# Patient Record
Sex: Female | Born: 1995 | Race: Black or African American | Hispanic: No | Marital: Single | Smoking: Never smoker
Health system: Southern US, Community
[De-identification: ages and names within clinical notes are randomized; demographics above are authoritative.]

## PROBLEM LIST (undated history)

## (undated) DIAGNOSIS — R011 Cardiac murmur, unspecified: Secondary | ICD-10-CM

## (undated) DIAGNOSIS — J9601 Acute respiratory failure with hypoxia: Secondary | ICD-10-CM

## (undated) DIAGNOSIS — J189 Pneumonia, unspecified organism: Secondary | ICD-10-CM

## (undated) DIAGNOSIS — R519 Headache, unspecified: Secondary | ICD-10-CM

## (undated) DIAGNOSIS — R51 Headache: Secondary | ICD-10-CM

## (undated) DIAGNOSIS — D649 Anemia, unspecified: Secondary | ICD-10-CM

## (undated) DIAGNOSIS — J45909 Unspecified asthma, uncomplicated: Secondary | ICD-10-CM

---

## 2017-04-05 HISTORY — PX: HYSTEROSCOPY: SHX211

## 2018-01-09 ENCOUNTER — Other Ambulatory Visit: Payer: Self-pay

## 2018-01-09 ENCOUNTER — Emergency Department (HOSPITAL_COMMUNITY): Payer: BLUE CROSS/BLUE SHIELD

## 2018-01-09 ENCOUNTER — Inpatient Hospital Stay (HOSPITAL_COMMUNITY)
Admission: EM | Admit: 2018-01-09 | Discharge: 2018-01-15 | DRG: 871 | Disposition: A | Discharge status: Home / Self Care | Payer: BLUE CROSS/BLUE SHIELD | Attending: Internal Medicine | Admitting: Internal Medicine

## 2018-01-09 ENCOUNTER — Encounter (HOSPITAL_COMMUNITY): Payer: Self-pay | Admitting: Emergency Medicine

## 2018-01-09 DIAGNOSIS — R0602 Shortness of breath: Secondary | ICD-10-CM

## 2018-01-09 DIAGNOSIS — J129 Viral pneumonia, unspecified: Secondary | ICD-10-CM | POA: Diagnosis present

## 2018-01-09 DIAGNOSIS — A419 Sepsis, unspecified organism: Secondary | ICD-10-CM | POA: Diagnosis present

## 2018-01-09 DIAGNOSIS — B9789 Other viral agents as the cause of diseases classified elsewhere: Secondary | ICD-10-CM | POA: Diagnosis present

## 2018-01-09 DIAGNOSIS — A4189 Other specified sepsis: Principal | ICD-10-CM | POA: Diagnosis present

## 2018-01-09 DIAGNOSIS — R509 Fever, unspecified: Secondary | ICD-10-CM

## 2018-01-09 DIAGNOSIS — J4521 Mild intermittent asthma with (acute) exacerbation: Secondary | ICD-10-CM | POA: Diagnosis present

## 2018-01-09 DIAGNOSIS — R6889 Other general symptoms and signs: Secondary | ICD-10-CM

## 2018-01-09 DIAGNOSIS — J189 Pneumonia, unspecified organism: Secondary | ICD-10-CM | POA: Diagnosis present

## 2018-01-09 DIAGNOSIS — E869 Volume depletion, unspecified: Secondary | ICD-10-CM | POA: Diagnosis present

## 2018-01-09 DIAGNOSIS — E876 Hypokalemia: Secondary | ICD-10-CM | POA: Diagnosis present

## 2018-01-09 DIAGNOSIS — J9601 Acute respiratory failure with hypoxia: Secondary | ICD-10-CM

## 2018-01-09 DIAGNOSIS — J181 Lobar pneumonia, unspecified organism: Secondary | ICD-10-CM | POA: Diagnosis present

## 2018-01-09 HISTORY — DX: Pneumonia, unspecified organism: J18.9

## 2018-01-09 HISTORY — DX: Unspecified asthma, uncomplicated: J45.909

## 2018-01-09 HISTORY — DX: Anemia, unspecified: D64.9

## 2018-01-09 HISTORY — DX: Acute respiratory failure with hypoxia: J96.01

## 2018-01-09 HISTORY — DX: Headache, unspecified: R51.9

## 2018-01-09 HISTORY — DX: Cardiac murmur, unspecified: R01.1

## 2018-01-09 HISTORY — DX: Headache: R51

## 2018-01-09 LAB — BASIC METABOLIC PANEL
Anion gap: 12 (ref 5–15)
BUN: 7 mg/dL (ref 6–20)
CALCIUM: 9.5 mg/dL (ref 8.9–10.3)
CO2: 24 mmol/L (ref 22–32)
CREATININE: 0.74 mg/dL (ref 0.44–1.00)
Chloride: 102 mmol/L (ref 98–111)
GFR calc non Af Amer: >60 mL/min (ref >60)
Glucose, Bld: 119 mg/dL — ABNORMAL HIGH (ref 70–99)
Potassium: 3.7 mmol/L (ref 3.5–5.1)
Sodium: 138 mmol/L (ref 135–145)

## 2018-01-09 LAB — CBC
HCT: 41.1 % (ref 36.0–46.0)
Hemoglobin: 13.1 g/dL (ref 12.0–15.0)
MCH: 29.9 pg (ref 26.0–34.0)
MCHC: 31.9 g/dL (ref 30.0–36.0)
MCV: 93.8 fL (ref 80.0–100.0)
NRBC: 0 % (ref 0.0–0.2)
PLATELETS: 281 10*3/uL (ref 150–400)
RBC: 4.38 MIL/uL (ref 3.87–5.11)
RDW: 12.3 % (ref 11.5–15.5)
WBC: 6.8 10*3/uL (ref 4.0–10.5)

## 2018-01-09 LAB — I-STAT CG4 LACTIC ACID, ED: Lactic Acid, Venous: 2.23 mmol/L (ref 0.5–1.9)

## 2018-01-09 LAB — I-STAT TROPONIN, ED: Troponin i, poc: 0 ng/mL (ref 0.00–0.08)

## 2018-01-09 LAB — I-STAT BETA HCG BLOOD, ED (MC, WL, AP ONLY)

## 2018-01-09 MED ORDER — ALBUTEROL SULFATE (2.5 MG/3ML) 0.083% IN NEBU
5.0000 mg | INHALATION_SOLUTION | Freq: Once | RESPIRATORY_TRACT | Status: AC
  Administered 2018-01-09: 5 mg via RESPIRATORY_TRACT
  Filled 2018-01-09: qty 6

## 2018-01-09 MED ORDER — SODIUM CHLORIDE 0.9 % IV BOLUS
1000.0000 mL | Freq: Once | INTRAVENOUS | Status: AC
  Administered 2018-01-09: 1000 mL via INTRAVENOUS

## 2018-01-09 MED ORDER — ALBUTEROL SULFATE HFA 108 (90 BASE) MCG/ACT IN AERS
1.0000 | INHALATION_SPRAY | RESPIRATORY_TRACT | Status: DC | PRN
  Filled 2018-01-09: qty 6.7

## 2018-01-09 MED ORDER — AEROCHAMBER PLUS FLO-VU LARGE MISC: 1.0000 | Freq: Once | Status: DC

## 2018-01-09 MED ORDER — IBUPROFEN 400 MG PO TABS
600.0000 mg | ORAL_TABLET | Freq: Once | ORAL | Status: AC
  Administered 2018-01-09: 600 mg via ORAL
  Filled 2018-01-09: qty 1

## 2018-01-09 MED ORDER — ACETAMINOPHEN 325 MG PO TABS
650.0000 mg | ORAL_TABLET | Freq: Once | ORAL | Status: AC | PRN
  Administered 2018-01-09: 650 mg via ORAL
  Filled 2018-01-09: qty 2

## 2018-01-09 NOTE — ED Triage Notes (Signed)
Pt reports an asthma attack that started a few hours ago. Pt had a coughing attack that led to sob. Pt reporting cp now. Pt does not have inhaler here with her since she recently moved. Pt reports chills. Temp of 103.81F in triage.

## 2018-01-09 NOTE — ED Provider Notes (Signed)
Patient placed in Quick Look pathway, seen and evaluated   Chief Complaint: Shortness of breath  HPI:   22 year old female presents today with 1 day history of fever, cough, shortness of breath and wheezing.  She notes a history of asthma, but does not have an inhaler at home.  ROS: Fever (one)  Physical Exam:   Gen: No distress  Neuro: Awake and Alert  Skin: Warm    Focused Exam: Crackles right lower lobe, bilateral minor expiratory wheeze-diminished sounds  Patient presents with acute febrile illness, question pneumonia versus influenza.  She has a temperature of 100.3, tachycardic to 144, nursing staff made aware patient needs next available bed.  Labs and imaging initiated, breathing treatment started.   Initiation of care has begun. The patient has been counseled on the process, plan, and necessity for staying for the completion/evaluation, and the remainder of the medical screening examination   Rosalio Loud 01/09/18 2126    Linwood Dibbles, MD 01/10/18 1048

## 2018-01-09 NOTE — ED Notes (Signed)
FLU SWAB SPECIMEN TUBED BACK DOWN TO MINI LAB FROM LAB CARRIER. HAND WALKED TO LAB AND HANDED DIRECTLY TO LAB PERSONNEL. LAB PERSONNEL VOICES RECEIPT OF SPECIMEN. CHARGE RN AWARE

## 2018-01-09 NOTE — ED Provider Notes (Signed)
[facility] EMERGENCY DEPARTMENT Provider Note   CSN: [id number] Arrival date & time: 01/09/18  2057     History   Chief Complaint Chief Complaint  Patient presents with  . Asthma  . Chest Pain    HPI Maria Owens is a 22 y.o. female.  Pt presents to the ED today with sob and cough.  She does have a hx of asthma, but has not had any attacks in "years."  The pt said she's had a cough.  She was unaware that she had a fever.  She has not yet had her flu shot.     History reviewed. No pertinent past medical history.  There are no active problems to display for this patient.   Past Surgical History:  Procedure Laterality Date  . IUD REMOVAL       OB History   None      Home Medications    Prior to Admission medications   Not on File    Family History No family history on file.  Social History Social History   Tobacco Use  . Smoking status: Never Smoker  . Smokeless tobacco: Never Used  Substance Use Topics  . Alcohol use: Yes    Comment: occ  . Drug use: Never     Allergies   Patient has no known allergies.   Review of Systems Review of Systems  Constitutional: Positive for fever.  Respiratory: Positive for cough, shortness of breath and wheezing.   All other systems reviewed and are negative.    Physical Exam Updated Vital Signs BP (!) 123/53   Pulse (!) 142   Temp (!) 103.2 F (39.6 C) (Oral)   Resp (!) 26   Ht 5\' 6"  (1.676 m)   Wt 54 kg   SpO2 96%   BMI 19.21 kg/m   Physical Exam  Constitutional: She is oriented to person, place, and time. She appears well-developed and well-nourished.  HENT:  Head: Normocephalic and atraumatic.  Eyes: Pupils are equal, round, and reactive to light. EOM are normal.  Neck: Normal range of motion. Neck supple.  Cardiovascular: Regular rhythm, intact distal pulses and normal pulses. Tachycardia present.  Pulmonary/Chest: Tachypnea noted. She has wheezes.  Abdominal: Soft.  Bowel sounds are normal.  Musculoskeletal: Normal range of motion.       Right lower leg: Normal.       Left lower leg: Normal.  Neurological: She is alert and oriented to person, place, and time.  Skin: Skin is warm and dry. Capillary refill takes less than 2 seconds.  Psychiatric: She has a normal mood and affect. Her behavior is normal.  Nursing note and vitals reviewed.    ED Treatments / Results  Labs (all labs ordered are listed, but only abnormal results are displayed) Labs Reviewed  BASIC METABOLIC PANEL - Abnormal; Notable for the following components:      Result Value   Glucose, Bld 119 (*)    All other components within normal limits  I-STAT CG4 LACTIC ACID, ED - Abnormal; Notable for the following components:   Lactic Acid, Venous 2.23 (*)    All other components within normal limits  CBC  INFLUENZA PANEL BY PCR (TYPE A & B)  I-STAT TROPONIN, ED  I-STAT BETA HCG BLOOD, ED ([facility], [facility], [facility] ONLY)  I-STAT CG4 LACTIC ACID, ED    EKG EKG Interpretation  Date/Time:  Thursday January 09 2018 21:06:03 EDT Ventricular Rate:  145 PR Interval:  QRS Duration: 68 QT Interval:  342 QTC Calculation: 531 R Axis:   81 Text Interpretation:  Supraventricular tachycardia Nonspecific ST and T wave abnormality Abnormal ECG No old tracing to compare Confirmed by Jacalyn Lefevre [phone]) on 01/09/2018 9:48:30 PM   Radiology Dg Chest Port 1 View  Result Date: 01/09/2018 CLINICAL DATA:  Shortness of breath EXAM: PORTABLE CHEST 1 VIEW COMPARISON:  None. FINDINGS: The heart size and mediastinal contours are within normal limits. Both lungs are clear. The visualized skeletal structures are unremarkable. IMPRESSION: No active disease. Electronically Signed   By: Deatra Robinson M.D.   On: 01/09/2018 22:55    Procedures Procedures (including critical care time)  Medications Ordered in ED Medications  sodium chloride 0.9 % bolus 1,000 mL (has no administration in time range)  albuterol  (PROVENTIL HFA;VENTOLIN HFA) 108 (90 Base) MCG/ACT inhaler 1-2 puff (has no administration in time range)  AEROCHAMBER PLUS FLO-VU LARGE MISC 1 each (has no administration in time range)  acetaminophen (TYLENOL) tablet 650 mg (650 mg Oral Given 01/09/18 2119)  albuterol (PROVENTIL) (2.5 MG/3ML) 0.083% nebulizer solution 5 mg (5 mg Nebulization Given 01/09/18 2121)  sodium chloride 0.9 % bolus 1,000 mL (0 mLs Intravenous Stopped 01/09/18 2314)  ibuprofen (ADVIL,MOTRIN) tablet 600 mg (600 mg Oral Given 01/09/18 2213)     Initial Impression / Assessment and Plan / ED Course  I have reviewed the triage vital signs and the nursing notes.  Pertinent labs & imaging results that were available during my care of the patient were reviewed by me and considered in my medical decision making (see chart for details).    Breathing is much improved.  Pt's fever treated with tylenol and ibuprofen.  She is given IVFs and nebs.  She is given an albuterol inhaler/spacer.  Flu test pending.  Pt to be signed out to Dr. Eudelia Bunch.  I anticipate d/c.  Final Clinical Impressions(s) / ED Diagnoses   Final diagnoses:  Mild intermittent asthma with exacerbation  Fever, unspecified fever cause  Flu-like symptoms    ED Discharge Orders    None       Jacalyn Lefevre, MD 01/09/18 2343

## 2018-01-10 ENCOUNTER — Emergency Department (HOSPITAL_COMMUNITY): Payer: BLUE CROSS/BLUE SHIELD

## 2018-01-10 ENCOUNTER — Other Ambulatory Visit: Payer: Self-pay

## 2018-01-10 ENCOUNTER — Other Ambulatory Visit (HOSPITAL_COMMUNITY): Payer: BLUE CROSS/BLUE SHIELD

## 2018-01-10 ENCOUNTER — Inpatient Hospital Stay (HOSPITAL_COMMUNITY): Payer: BLUE CROSS/BLUE SHIELD

## 2018-01-10 DIAGNOSIS — J129 Viral pneumonia, unspecified: Secondary | ICD-10-CM | POA: Diagnosis present

## 2018-01-10 DIAGNOSIS — B9789 Other viral agents as the cause of diseases classified elsewhere: Secondary | ICD-10-CM | POA: Diagnosis present

## 2018-01-10 DIAGNOSIS — A419 Sepsis, unspecified organism: Secondary | ICD-10-CM | POA: Diagnosis present

## 2018-01-10 DIAGNOSIS — J181 Lobar pneumonia, unspecified organism: Secondary | ICD-10-CM | POA: Diagnosis present

## 2018-01-10 DIAGNOSIS — R0602 Shortness of breath: Secondary | ICD-10-CM | POA: Diagnosis present

## 2018-01-10 DIAGNOSIS — J9601 Acute respiratory failure with hypoxia: Secondary | ICD-10-CM | POA: Diagnosis present

## 2018-01-10 DIAGNOSIS — E869 Volume depletion, unspecified: Secondary | ICD-10-CM | POA: Diagnosis present

## 2018-01-10 DIAGNOSIS — J189 Pneumonia, unspecified organism: Secondary | ICD-10-CM | POA: Diagnosis not present

## 2018-01-10 DIAGNOSIS — J9621 Acute and chronic respiratory failure with hypoxia: Secondary | ICD-10-CM | POA: Diagnosis not present

## 2018-01-10 DIAGNOSIS — R000 Tachycardia, unspecified: Secondary | ICD-10-CM

## 2018-01-10 DIAGNOSIS — A4189 Other specified sepsis: Secondary | ICD-10-CM | POA: Diagnosis present

## 2018-01-10 DIAGNOSIS — I361 Nonrheumatic tricuspid (valve) insufficiency: Secondary | ICD-10-CM | POA: Diagnosis not present

## 2018-01-10 DIAGNOSIS — E876 Hypokalemia: Secondary | ICD-10-CM | POA: Diagnosis present

## 2018-01-10 DIAGNOSIS — R6520 Severe sepsis without septic shock: Secondary | ICD-10-CM

## 2018-01-10 DIAGNOSIS — J4521 Mild intermittent asthma with (acute) exacerbation: Secondary | ICD-10-CM | POA: Diagnosis present

## 2018-01-10 DIAGNOSIS — R509 Fever, unspecified: Secondary | ICD-10-CM | POA: Diagnosis not present

## 2018-01-10 LAB — PHOSPHORUS: Phosphorus: 2 mg/dL — ABNORMAL LOW (ref 2.5–4.6)

## 2018-01-10 LAB — INFLUENZA PANEL BY PCR (TYPE A & B)
INFLAPCR: NEGATIVE
Influenza B By PCR: NEGATIVE

## 2018-01-10 LAB — T4, FREE: Free T4: 0.99 ng/dL (ref 0.82–1.77)

## 2018-01-10 LAB — BRAIN NATRIURETIC PEPTIDE: B Natriuretic Peptide: 320.8 pg/mL — ABNORMAL HIGH (ref 0.0–100.0)

## 2018-01-10 LAB — BLOOD GAS, ARTERIAL
Acid-base deficit: 4.7 mmol/L — ABNORMAL HIGH (ref 0.0–2.0)
Bicarbonate: 18.7 mmol/L — ABNORMAL LOW (ref 20.0–28.0)
Drawn by: 519031
FIO2: 21
O2 Saturation: 83.2 %
Patient temperature: 98.6
pCO2 arterial: 27.8 mmHg — ABNORMAL LOW (ref 32.0–48.0)
pH, Arterial: 7.441 (ref 7.350–7.450)
pO2, Arterial: 47.3 mmHg — ABNORMAL LOW (ref 83.0–108.0)

## 2018-01-10 LAB — CREATININE, SERUM
Creatinine, Ser: 0.77 mg/dL (ref 0.44–1.00)
GFR calc Af Amer: >60 mL/min (ref >60)
GFR calc non Af Amer: >60 mL/min (ref >60)

## 2018-01-10 LAB — CBC
HCT: 33.2 % — ABNORMAL LOW (ref 36.0–46.0)
Hemoglobin: 10.8 g/dL — ABNORMAL LOW (ref 12.0–15.0)
MCH: 30.7 pg (ref 26.0–34.0)
MCHC: 32.5 g/dL (ref 30.0–36.0)
MCV: 94.3 fL (ref 80.0–100.0)
Platelets: 192 10*3/uL (ref 150–400)
RBC: 3.52 MIL/uL — ABNORMAL LOW (ref 3.87–5.11)
RDW: 12.7 % (ref 11.5–15.5)
WBC: 3.8 10*3/uL — ABNORMAL LOW (ref 4.0–10.5)
nRBC: 0 % (ref 0.0–0.2)

## 2018-01-10 LAB — MAGNESIUM: Magnesium: 1.3 mg/dL — ABNORMAL LOW (ref 1.7–2.4)

## 2018-01-10 LAB — LACTIC ACID, PLASMA
LACTIC ACID, VENOUS: 3.5 mmol/L — AB (ref 0.5–1.9)
Lactic Acid, Venous: 1.3 mmol/L (ref 0.5–1.9)

## 2018-01-10 LAB — PROCALCITONIN: Procalcitonin: 8.83 ng/mL

## 2018-01-10 LAB — HIV ANTIBODY (ROUTINE TESTING W REFLEX): HIV Screen 4th Generation wRfx: NONREACTIVE

## 2018-01-10 LAB — TSH: TSH: 0.548 u[IU]/mL (ref 0.350–4.500)

## 2018-01-10 LAB — STREP PNEUMONIAE URINARY ANTIGEN: Strep Pneumo Urinary Antigen: NEGATIVE

## 2018-01-10 MED ORDER — LEVALBUTEROL HCL 0.63 MG/3ML IN NEBU: 0.6300 mg | INHALATION_SOLUTION | Freq: Four times a day (QID) | RESPIRATORY_TRACT | Status: DC | PRN

## 2018-01-10 MED ORDER — ACETAMINOPHEN 325 MG PO TABS
650.0000 mg | ORAL_TABLET | Freq: Four times a day (QID) | ORAL | Status: DC | PRN
  Administered 2018-01-10 – 2018-01-12 (×6): 650 mg via ORAL
  Filled 2018-01-10 (×6): qty 2

## 2018-01-10 MED ORDER — LORATADINE 10 MG PO TABS: 10.0000 mg | ORAL_TABLET | Freq: Every day | ORAL | Status: DC

## 2018-01-10 MED ORDER — SODIUM CHLORIDE 0.9 % IV BOLUS: 1000.0000 mL | Freq: Once | INTRAVENOUS | Status: DC

## 2018-01-10 MED ORDER — SODIUM CHLORIDE 0.9 % IV BOLUS: 500.0000 mL | Freq: Once | INTRAVENOUS | Status: DC

## 2018-01-10 MED ORDER — VANCOMYCIN HCL IN DEXTROSE 750-5 MG/150ML-% IV SOLN
750.0000 mg | Freq: Three times a day (TID) | INTRAVENOUS | Status: DC
  Administered 2018-01-10 – 2018-01-13 (×9): 750 mg via INTRAVENOUS
  Filled 2018-01-10 (×10): qty 150

## 2018-01-10 MED ORDER — SODIUM CHLORIDE 0.9 % IV SOLN
500.0000 mg | Freq: Once | INTRAVENOUS | Status: AC
  Administered 2018-01-10: 500 mg via INTRAVENOUS
  Filled 2018-01-10: qty 500

## 2018-01-10 MED ORDER — PHENOL 1.4 % MT LIQD
1.0000 | OROMUCOSAL | Status: DC | PRN
  Administered 2018-01-10: 1 via OROMUCOSAL
  Filled 2018-01-10: qty 177

## 2018-01-10 MED ORDER — OSELTAMIVIR PHOSPHATE 75 MG PO CAPS
75.0000 mg | ORAL_CAPSULE | Freq: Two times a day (BID) | ORAL | Status: DC
  Administered 2018-01-10 – 2018-01-11 (×2): 75 mg via ORAL
  Filled 2018-01-10 (×3): qty 1

## 2018-01-10 MED ORDER — BENZONATATE 100 MG PO CAPS
200.0000 mg | ORAL_CAPSULE | Freq: Two times a day (BID) | ORAL | Status: DC | PRN
  Administered 2018-01-10 – 2018-01-14 (×4): 200 mg via ORAL
  Filled 2018-01-10 (×4): qty 2

## 2018-01-10 MED ORDER — IPRATROPIUM-ALBUTEROL 0.5-2.5 (3) MG/3ML IN SOLN
3.0000 mL | Freq: Three times a day (TID) | RESPIRATORY_TRACT | Status: DC
  Filled 2018-01-10: qty 3

## 2018-01-10 MED ORDER — ALBUTEROL SULFATE (2.5 MG/3ML) 0.083% IN NEBU: 3.0000 mL | INHALATION_SOLUTION | RESPIRATORY_TRACT | Status: DC | PRN

## 2018-01-10 MED ORDER — SODIUM CHLORIDE 0.9 % IV BOLUS
1000.0000 mL | Freq: Once | INTRAVENOUS | Status: AC
  Administered 2018-01-10: 1000 mL via INTRAVENOUS

## 2018-01-10 MED ORDER — SODIUM CHLORIDE 0.9 % IV SOLN
500.0000 mg | INTRAVENOUS | Status: DC
  Administered 2018-01-11 – 2018-01-14 (×4): 500 mg via INTRAVENOUS
  Filled 2018-01-10 (×4): qty 500

## 2018-01-10 MED ORDER — ENOXAPARIN SODIUM 40 MG/0.4ML ~~LOC~~ SOLN
40.0000 mg | SUBCUTANEOUS | Status: DC
  Filled 2018-01-10 (×5): qty 0.4

## 2018-01-10 MED ORDER — IPRATROPIUM-ALBUTEROL 0.5-2.5 (3) MG/3ML IN SOLN
3.0000 mL | Freq: Once | RESPIRATORY_TRACT | Status: AC
  Administered 2018-01-10: 3 mL via RESPIRATORY_TRACT

## 2018-01-10 MED ORDER — SODIUM CHLORIDE 0.9 % IV SOLN
INTRAVENOUS | Status: AC
  Administered 2018-01-10 – 2018-01-11 (×3): via INTRAVENOUS

## 2018-01-10 MED ORDER — SODIUM CHLORIDE 0.9 % IV SOLN
INTRAVENOUS | Status: DC
  Administered 2018-01-10 (×2): via INTRAVENOUS

## 2018-01-10 MED ORDER — MONTELUKAST SODIUM 10 MG PO TABS: 10.0000 mg | ORAL_TABLET | Freq: Every day | ORAL | Status: DC

## 2018-01-10 MED ORDER — LEVALBUTEROL HCL 1.25 MG/0.5ML IN NEBU
1.2500 mg | INHALATION_SOLUTION | Freq: Four times a day (QID) | RESPIRATORY_TRACT | Status: DC
  Filled 2018-01-10: qty 0.5

## 2018-01-10 MED ORDER — SODIUM CHLORIDE 0.9 % IV SOLN
1.0000 g | Freq: Once | INTRAVENOUS | Status: AC
  Administered 2018-01-10: 1 g via INTRAVENOUS
  Filled 2018-01-10: qty 10

## 2018-01-10 MED ORDER — IOPAMIDOL (ISOVUE-370) INJECTION 76%
INTRAVENOUS | Status: AC
  Filled 2018-01-10: qty 100

## 2018-01-10 MED ORDER — MAGNESIUM SULFATE 2 GM/50ML IV SOLN
2.0000 g | Freq: Once | INTRAVENOUS | Status: AC
  Administered 2018-01-10: 2 g via INTRAVENOUS
  Filled 2018-01-10: qty 50

## 2018-01-10 MED ORDER — IPRATROPIUM-ALBUTEROL 0.5-2.5 (3) MG/3ML IN SOLN
3.0000 mL | Freq: Four times a day (QID) | RESPIRATORY_TRACT | Status: DC
  Filled 2018-01-10: qty 3

## 2018-01-10 MED ORDER — PIPERACILLIN-TAZOBACTAM 3.375 G IVPB
3.3750 g | Freq: Three times a day (TID) | INTRAVENOUS | Status: DC
  Administered 2018-01-10 – 2018-01-11 (×3): 3.375 g via INTRAVENOUS
  Filled 2018-01-10 (×3): qty 50

## 2018-01-10 MED ORDER — SODIUM CHLORIDE 0.9 % IV BOLUS
500.0000 mL | Freq: Once | INTRAVENOUS | Status: AC
  Administered 2018-01-10: 500 mL via INTRAVENOUS

## 2018-01-10 MED ORDER — ACETAMINOPHEN 325 MG PO TABS
325.0000 mg | ORAL_TABLET | Freq: Once | ORAL | Status: AC
  Administered 2018-01-10: 325 mg via ORAL
  Filled 2018-01-10: qty 1

## 2018-01-10 MED ORDER — VANCOMYCIN HCL IN DEXTROSE 1-5 GM/200ML-% IV SOLN
1000.0000 mg | Freq: Once | INTRAVENOUS | Status: AC
  Administered 2018-01-10: 1000 mg via INTRAVENOUS
  Filled 2018-01-10: qty 200

## 2018-01-10 MED ORDER — LEVALBUTEROL HCL 1.25 MG/0.5ML IN NEBU
1.2500 mg | INHALATION_SOLUTION | Freq: Once | RESPIRATORY_TRACT | Status: AC
  Administered 2018-01-10: 1.25 mg via RESPIRATORY_TRACT
  Filled 2018-01-10: qty 0.5

## 2018-01-10 MED ORDER — IOPAMIDOL (ISOVUE-370) INJECTION 76%
100.0000 mL | Freq: Once | INTRAVENOUS | Status: AC | PRN
  Administered 2018-01-10: 100 mL via INTRAVENOUS

## 2018-01-10 MED ORDER — BUDESONIDE 0.25 MG/2ML IN SUSP
0.2500 mg | Freq: Two times a day (BID) | RESPIRATORY_TRACT | Status: DC
  Filled 2018-01-10: qty 2

## 2018-01-10 MED ORDER — IPRATROPIUM-ALBUTEROL 0.5-2.5 (3) MG/3ML IN SOLN: 3.0000 mL | Freq: Four times a day (QID) | RESPIRATORY_TRACT | Status: DC | PRN

## 2018-01-10 MED ORDER — DM-GUAIFENESIN ER 30-600 MG PO TB12
1.0000 | ORAL_TABLET | Freq: Two times a day (BID) | ORAL | Status: DC | PRN
  Administered 2018-01-10: 1 via ORAL
  Filled 2018-01-10: qty 1

## 2018-01-10 NOTE — Progress Notes (Signed)
Patient has MEWS score of 3 but this is not an acute change the patient came from the emergency department tachycardic therefore RRT nurse has not been notified. Nursing will do vitals every two hours times two per the MEWS guidelines and continue to monitor.

## 2018-01-10 NOTE — Significant Event (Addendum)
Rapid Response Event Note  Overview: While rounding, bedside RN asked me to look at pt who is persistently tachycardic and tachypnic.     Initial Focused Assessment: On arrival, pt resting in bed. HR 140s, RR 33, spO2 98% on 4L [place]. Pt c/o pain in throat. Lung clear/diminished with shallow breathing noted.   Interventions: Spoke with RN about pt elevated Lactic with no repeat and encouraged her to page Md to trend lactate. Also informed about temperature control and tylenol to help with HR and discomfort. Bodenheimer notified and STAT lactic ordered with plans to bolus as needed. I placed a humidifier to O2 for comfort and educated pt on not getting out of bed without oxygen. O2 extension tubing placed to facilitate this.   Plan of Care (if not transferred): Continue to monitor pt status, give tylenol for fever, give scheduled abx.  RN instructed to call with any other changes or concerns.   Event Summary:  called at  2045   event ended at  2105  Called back by RN at 2112 stating pt had got up to bathroom and is now extremely tachycardic and SOB with some hymoptysis noted. On arrival pt sitting in bed with obvious work of breathing. HR 150s, RR 40s spO2 99% on 4L temp 103 orally. Instructed pt to take some slow deep breaths. Bedside RN paged Md and received an order for a neb treatment and tessalon capsule. I paged Md at 2125 and expressed my concern for pts overall assessment. Additional tylenol ordered, cxr, abg, mag replacement, sputum sample ordered. Waiting for Lactic and will bolus as needed. Hope with temp control, fluids, abx, pt will normalize HR and RR. Instructed pt and family not to get out of bed to bathroom the rest of the night in order to preserve her energy and help control her HR and RR. Pt and family agreeable. Bedside RN aware of plan and instructed to call with any concerns or changes.     2325: called RN to check on pt. Stated she looked much better but is refusing abg.  Will continue to monitor  0115: Noticed pt BP low. Last filed was 68/58. On arrival to unit, BP 85/49. bolus ordered, Bodenheimer notified, an additional ordered. Instructed to call if her BP does not improve with liter bolus. Bedside RN made aware. Will follow up.     Mordecai Rasmussen

## 2018-01-10 NOTE — H&P (Addendum)
History and Physical  Maria Owens VWU:[id number] DOB: 24-Jan-1996 DOA: 01/09/2018  Referring physician: ER provider. PCP: Patient, No Pcp Per  Outpatient Specialists:    Patient coming from: Home  Chief Complaint: Fever, shortness of breath and tachycardia.  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  Patient presents with 1 day history of fever (103.2 F), shortness of breath, tachycardia and cough.  No history of sick contacts.  No joint pains generalized weakness.  No headache, no neck pain, no chest pain, and no urinary symptoms.  On further questioning, patient endorsed nausea and vomiting.  On presentation to the hospital, no significant leukocytosis was noted.  Chest x-ray did not reveal any acute abnormalities.  However, CT Angio of the chest was negative for pulmonary embolism, but revealed findings suggestive of possible multifocal pneumonia versus lung changes suggestive of vaping related lung injury.  Patient has consistently denied use of tobacco products of vaping.  Tachycardia persists.  Patient continues to report shortness of breath.  Influenza came back negative.  Patient has remained significantly tachycardic.  Patient has been admitted for further assessment and management.  ED Course: Patient's shortness of breath and fever were extensively worked up in the ER.  kindly the results below.  Patient was also started on IV antibiotics by the ER provider.   Pertinent labs: Chemistry reveals sodium of 138, potassium of 3.7, chloride 102, CO2 24, BUN of 7 and creatinine of 0.74.  Point-of-care troponin was 0.00.  Initial lactic acid was 2.23, but increased to 3.5.  CBC reveals WBC of 6.8, hemoglobin of 13.1, hematocrit of 41.1, MCV of 93.8 with platelet count of 281.  Influenza PCR came back negative.  Chest x-ray said not to reveal any active disease.  However, CT Angio of the chest was  negative for pulmonary embolism, but revealed multifocal consolidation trouble lungs, suspicious for pneumonia.  Lactic acid on presentation was 2.23, but has risen to 3.35.  As per radiologist documentation, as such CT angiogram chest finding could also be seen with vaping related lung injury.  Moderate central bronchial wall thickening was also reported.  EKG: Independently reviewed.   Imaging: independently reviewed.   Review of Systems:  Negative for visual changes, rash, new muscle aches, chest pain, dysuria, bleeding, n/v/abdominal pain.  History reviewed. No pertinent past medical history.  Past Surgical History:  Procedure Laterality Date  . IUD REMOVAL       reports that she has never smoked. She has never used smokeless tobacco. She reports that she drinks alcohol. She reports that she does not use drugs.  No Known Allergies  No family history on file.   Prior to Admission medications   Medication Sig Start Date End Date Taking? Authorizing Provider  Acetaminophen-DM (VICKS DAYQUIL HBP COLD & FLU) 325-10 MG CAPS Take 1 capsule by mouth 2 (two) times daily as needed (for cough).   Yes [provider]    Physical Exam: Vitals:   01/10/18 0345 01/10/18 0438 01/10/18 0508 01/10/18 0801  BP: (!) 107/55 (!) 107/54 (!) 125/57   Pulse: (!) 137 95 (!) 137 (!) 135  Resp: (!) 24 14 16 18   Temp:   100.3 F (37.9 C)   TempSrc:   Oral   SpO2: 95% 97% 98% 100%  Weight:   56.9 kg   Height:   5\' 6"  (1.676 m)    Constitutional:  . Acutely ill looking.  Eyes:  . No pallor. No jaundice.  ENMT:  . external ears, nose appear normal.  Dry buccal mucosa. Neck:  . Neck is supple. No JVD Respiratory:  . Decreased air entry at the right lower base posteriorly.    . Cardiovascular:  . S1S2, tachycardic . No LE extremity edema   Abdomen:  . Abdomen is soft and non tender. Organs are difficult to assess. Neurologic:  . Awake and alert. . Moves all limbs.  Wt  Readings from Last 3 Encounters:  01/10/18 56.9 kg (45 %, Z= -0.14)*   * Growth percentiles are based on CDC (Girls, 2-20 Years) data.    I have personally reviewed following labs and imaging studies  Labs on Admission:  CBC: Recent Labs  Lab 01/09/18 2118  WBC 6.8  HGB 13.1  HCT 41.1  MCV 93.8  PLT 281   Basic Metabolic Panel: Recent Labs  Lab 01/09/18 2118  NA 138  K 3.7  CL 102  CO2 24  GLUCOSE 119*  BUN 7  CREATININE 0.74  CALCIUM 9.5   Liver Function Tests: No results for input(s): AST, ALT, ALKPHOS, BILITOT, PROT, ALBUMIN in the last 168 hours. No results for input(s): LIPASE, AMYLASE in the last 168 hours. No results for input(s): AMMONIA in the last 168 hours. Coagulation Profile: No results for input(s): INR, PROTIME in the last 168 hours. Cardiac Enzymes: No results for input(s): CKTOTAL, CKMB, CKMBINDEX, TROPONINI in the last 168 hours. BNP (last 3 results) No results for input(s): PROBNP in the last 8760 hours. HbA1C: No results for input(s): HGBA1C in the last 72 hours. CBG: No results for input(s): GLUCAP in the last 168 hours. Lipid Profile: No results for input(s): CHOL, HDL, LDLCALC, TRIG, CHOLHDL, LDLDIRECT in the last 72 hours. Thyroid Function Tests: No results for input(s): TSH, T4TOTAL, FREET4, T3FREE, THYROIDAB in the last 72 hours. Anemia Panel: No results for input(s): VITAMINB12, FOLATE, FERRITIN, TIBC, IRON, RETICCTPCT in the last 72 hours. Urine analysis: No results found for: COLORURINE, APPEARANCEUR, LABSPEC, PHURINE, GLUCOSEU, HGBUR, BILIRUBINUR, KETONESUR, PROTEINUR, UROBILINOGEN, NITRITE, LEUKOCYTESUR Sepsis Labs: @LABRCNTIP (procalcitonin:4,lacticidven:4) )No results found for this or any previous visit (from the past 240 hour(s)).    Radiological Exams on Admission: Ct Angio Chest Pe W And/or Wo Contrast  Result Date: 01/10/2018 CLINICAL DATA:  PE suspected, high prob, normal CXR. Shortness of breath. Cough. EXAM: CT  ANGIOGRAPHY CHEST WITH CONTRAST TECHNIQUE: Multidetector CT imaging of the chest was performed using the standard protocol during bolus administration of intravenous contrast. Multiplanar CT image reconstructions and MIPs were obtained to evaluate the vascular anatomy. CONTRAST:  ISOVUE-370 IOPAMIDOL (ISOVUE-370) INJECTION 76% COMPARISON:  Chest radiograph yesterday. FINDINGS: Cardiovascular: There are no filling defects within the pulmonary arteries to suggest pulmonary embolus. Thoracic aorta is normal in caliber. Heart is normal in size. No pericardial effusion. Mediastinum/Nodes: Shotty bilateral hilar adenopathy with nodes measuring 11 mm on the left and 12 mm on the right. Shotty mediastinal lymph nodes all subcentimeter. 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 Esophagus is nondistended. No visualized thyroid nodule. Lungs/Pleura: Multifocal consolidative and ground-glass opacities throughout both lungs, more prominent on the left and greatest in the left lower lobe. There is moderate central bronchial thickening. No significant pleural fluid. Upper Abdomen: No acute abnormality. Musculoskeletal: There are no acute or suspicious osseous abnormalities. Review of the MIP images confirms the above findings. IMPRESSION: 1. No pulmonary embolus. 2. Multifocal consolidations throughout both lungs, suspicious for pneumonia. Findings can also be seen with  vaping-related lung injury, recommend clinical correlation. 3. Moderate central bronchial wall thickening may be infectious or inflammatory such as asthma. Electronically Signed   By: Narda Rutherford M.D.   On: 01/10/2018 03:12   Dg Chest Port 1 View  Result Date: 01/09/2018 CLINICAL DATA:  Shortness of breath EXAM: PORTABLE CHEST 1 VIEW COMPARISON:  None. FINDINGS: The heart size and mediastinal contours are within normal limits. Both lungs are clear. The visualized skeletal structures are unremarkable. IMPRESSION: No active disease.  Electronically Signed   By: Deatra Robinson M.D.   On: 01/09/2018 22:55    EKG: Independently reviewed.   Principal Problem:   Lobar pneumonia (HCC) Active Problems:   Sepsis (HCC)   Multifocal pneumonia   Assessment/Plan Sepsis/SIRS: Source abscess sepsis is possible multifocal pneumonia. Patient is at risk for aspiration pneumonia, considering history of nausea and vomiting as well. Panculture patient. IV antibiotics.  Multifocal pneumonia: No leukocytosis noted. CT chest finding is noted. Follow-up blood cultures. IV antibiotics. This could be aspiration versus atypical pneumonia. IV azithromycin included in patient's antibiotics regimen. Further management will depend on hospital course.  Possible viral syndrome: Influenza A and B came back negative. Will still start patient on Tamiflu for now. Continue to monitor clinical progress, and adjust medications accordingly. Possible viral pneumonia. Patient is on IV vancomycin as well.  Volume depletion: Likely secondary to nausea and vomiting. Cautiously hydrate patient.  Nausea and vomiting: Managed expectantly. Monitor renal function and electrolytes. Correct abnormal electrolytes.  Sinus tachycardia: Likely multifactorial, secondary to above. Treat underlying problems as documented above. Adequately hydrate patient. Continue to monitor.  DVT prophylaxis: Subcutaneous Lovenox Code Status: Full code Family Communication: Mother. Disposition Plan: Home eventually Consults called: None for now Admission status: Inpatient.  Patient will be admitted and managed on an inpatient basis.  Patient is critically ill.  If not managed on an inpatient basis, patient has a high likelihood of deterioration.  Time spent: 65 minutes  Berton Mount, MD  [facility] Pager #: [phone] 7PM-7AM contact night coverage as above  01/10/2018, 8:41 AM

## 2018-01-10 NOTE — Progress Notes (Signed)
RT placed pt on 4L [place] due to abg results

## 2018-01-10 NOTE — Progress Notes (Signed)
RT came to do neb tx. Neb tx on HOLD due to increased HR. RN notified

## 2018-01-10 NOTE — Progress Notes (Signed)
RRT and MD at the bedside to assess pt.

## 2018-01-10 NOTE — Progress Notes (Signed)
Patient refused blood gas per respiratory therapist, made Bodenheimer, NP aware.

## 2018-01-10 NOTE — Progress Notes (Signed)
RT attempt times 2 for abg. RT unable to obtain. Pt refuses neb tx - pt states it increases HR. RT paged Dartha Lodge, MD. No return call at this time

## 2018-01-10 NOTE — Progress Notes (Signed)
Second therapist to attempt to receive ABG at this time, family had multiple questions and seemed agitated and refused second attempt. No distress noted at this time.

## 2018-01-10 NOTE — ED Notes (Signed)
IV abx held pending phlebotomy obtaining admitting blood cx.

## 2018-01-10 NOTE — Progress Notes (Addendum)
RT attempt times 2 for abg, unable to obtain. Will have another RT attempt.

## 2018-01-10 NOTE — ED Provider Notes (Signed)
I assumed care of this patient from Dr. Particia Nearing at 2330.  Please see their note for further details of Hx, PE.  Briefly patient is a 22 y.o. female who presented with SOB and tachycardia in the setting of fever and likely influenza. Labs reassuring. Pending influenza PCR. CXR negative. Has gotten breathing treatments and IVFs.   Current plan is to monitor for HR improvement and follow up on PCR.  Patient was persistently tachycardic even after 3L and despite not having another breathing treatment. Since she was SOB, she was given another breathing treatment. CT ordered to rule out underlying PNA (not seen on CXR) or PE (given recent travel).  CTA negative for PE but did reveal multifocal infiltrates concerning for pneumonia.  Patient question regarding vaping and adamantly denied.  Started on empiric antibiotics and admitted to medicine for continued management.  CRITICAL CARE Performed by: Amadeo Garnet Linzy Darling Total critical care time: 30 minutes Critical care time was exclusive of separately billable procedures and treating other patients. Critical care was necessary to treat or prevent imminent or life-threatening deterioration. Critical care was time spent personally by me on the following activities: development of treatment plan with patient and/or surrogate as well as nursing, discussions with consultants, evaluation of patient's response to treatment, examination of patient, obtaining history from patient or surrogate, ordering and performing treatments and interventions, ordering and review of laboratory studies, ordering and review of radiographic studies, pulse oximetry and re-evaluation of patient's condition.  Final diagnoses:  Mild intermittent asthma with exacerbation  Fever, unspecified fever cause  Flu-like symptoms  Multifocal pneumonia        Marquetta Weiskopf, Amadeo Garnet, MD 01/10/18 [phone]

## 2018-01-10 NOTE — Progress Notes (Signed)
Pharmacy Antibiotic Note  Maria Owens is a 22 y.o. female admitted on 01/09/2018 with sepsis.  Pharmacy has been consulted for vancomycin dosing.  Given azithromycin and ceftriaxone x 1 in ED.  SCr 0.74, CrCl ~ 101 mL/min.  Plan: Vancomycin 1000mg  IV x 1, then 750mg  IV every 8 hours Monitor renal function, Cx data, clinical progression Vancomycin level at steady state  Height: 5\' 6"  (167.6 cm) Weight: 125 lb 6.4 oz (56.9 kg) IBW/kg (Calculated) : 59.3  Temp (24hrs), Avg:101.3 F (38.5 C), Min:100.3 F (37.9 C), Max:103.2 F (39.6 C)  Recent Labs  Lab 01/09/18 2118 01/09/18 2142 01/10/18 0540  WBC 6.8  --   --   CREATININE 0.74  --   --   LATICACIDVEN  --  2.23* 3.5*    Estimated Creatinine Clearance: 101.6 mL/min (by C-G formula based on SCr of 0.74 mg/dL).    No Known Allergies  Antimicrobials this admission: Ceftriaxone x 1 10/18 Azithromycin x 1 10/18 Vanc 10/18>>   Microbiology results: 10/18 BCx: sent 10/18 sputum: sent   Daylene Posey, PharmD Clinical Pharmacist Please check AMION for all [facility] Pharmacy numbers 01/10/2018 9:11 AM

## 2018-01-10 NOTE — Progress Notes (Signed)
RRT and MD made aware of MEWS score and patients current status.

## 2018-01-10 NOTE — Progress Notes (Signed)
Pt transferred to 5W, report given to receiving RN. Pt made aware of room change.

## 2018-01-10 NOTE — ED Notes (Signed)
Pt c/o worsening SOB after walking back from bathroom. O2 Sats 100% on RA, LS CTAB throughout w/ no comminuted upper airway sounds. Pt guided through slow deep breathing and reassurance provided.

## 2018-01-10 NOTE — Care Management (Signed)
This is a no charge note  Pending admission per Dr. Eudelia Bunch  22 year old lady with past medical history of childhood asthma, who presents with cough, shortness of breath, fever and chills.  Patient denies vaping.  Found to have sepsis, soft blood pressure.  CT angiogram of chest is negative for PE, but showed multifocal pneumonia.  Patient started with Rocephin and azithromycin.  Admitted to telemetry bed as inpatient.   Lorretta Harp, MD  [facility] Pager [phone]  If 7PM-7AM, please contact night-coverage [email or web address] Password [facility] 01/10/2018, 3:43 AM

## 2018-01-11 DIAGNOSIS — A419 Sepsis, unspecified organism: Secondary | ICD-10-CM

## 2018-01-11 DIAGNOSIS — J189 Pneumonia, unspecified organism: Secondary | ICD-10-CM

## 2018-01-11 DIAGNOSIS — J181 Lobar pneumonia, unspecified organism: Secondary | ICD-10-CM

## 2018-01-11 DIAGNOSIS — R509 Fever, unspecified: Secondary | ICD-10-CM

## 2018-01-11 DIAGNOSIS — J9621 Acute and chronic respiratory failure with hypoxia: Secondary | ICD-10-CM

## 2018-01-11 DIAGNOSIS — J9601 Acute respiratory failure with hypoxia: Secondary | ICD-10-CM

## 2018-01-11 LAB — URINALYSIS, COMPLETE (UACMP) WITH MICROSCOPIC
Bilirubin Urine: NEGATIVE
Glucose, UA: NEGATIVE mg/dL
Hgb urine dipstick: NEGATIVE
Ketones, ur: NEGATIVE mg/dL
Leukocytes, UA: NEGATIVE
Nitrite: NEGATIVE
Protein, ur: NEGATIVE mg/dL
Specific Gravity, Urine: 1.005 (ref 1.005–1.030)
pH: 7 (ref 5.0–8.0)

## 2018-01-11 LAB — COMPREHENSIVE METABOLIC PANEL
ALT: 13 U/L (ref 0–44)
AST: 19 U/L (ref 15–41)
Albumin: 2.5 g/dL — ABNORMAL LOW (ref 3.5–5.0)
Alkaline Phosphatase: 45 U/L (ref 38–126)
Anion gap: 6 (ref 5–15)
BUN: <5 mg/dL — ABNORMAL LOW (ref 6–20)
CO2: 21 mmol/L — ABNORMAL LOW (ref 22–32)
Calcium: 7.9 mg/dL — ABNORMAL LOW (ref 8.9–10.3)
Chloride: 110 mmol/L (ref 98–111)
Creatinine, Ser: 0.67 mg/dL (ref 0.44–1.00)
GFR calc Af Amer: >60 mL/min (ref >60)
GFR calc non Af Amer: >60 mL/min (ref >60)
Glucose, Bld: 112 mg/dL — ABNORMAL HIGH (ref 70–99)
Potassium: 3.4 mmol/L — ABNORMAL LOW (ref 3.5–5.1)
Sodium: 137 mmol/L (ref 135–145)
Total Bilirubin: 0.8 mg/dL (ref 0.3–1.2)
Total Protein: 5.3 g/dL — ABNORMAL LOW (ref 6.5–8.1)

## 2018-01-11 LAB — RESPIRATORY PANEL BY PCR
ADENOVIRUS-RVPPCR: NOT DETECTED
Bordetella pertussis: NOT DETECTED
CORONAVIRUS HKU1-RVPPCR: NOT DETECTED
CORONAVIRUS NL63-RVPPCR: NOT DETECTED
Chlamydophila pneumoniae: NOT DETECTED
Coronavirus 229E: NOT DETECTED
Coronavirus OC43: NOT DETECTED
Influenza A: NOT DETECTED
Influenza B: NOT DETECTED
METAPNEUMOVIRUS-RVPPCR: NOT DETECTED
Mycoplasma pneumoniae: NOT DETECTED
PARAINFLUENZA VIRUS 2-RVPPCR: NOT DETECTED
PARAINFLUENZA VIRUS 3-RVPPCR: NOT DETECTED
Parainfluenza Virus 1: DETECTED — AB
Parainfluenza Virus 4: NOT DETECTED
RHINOVIRUS / ENTEROVIRUS - RVPPCR: DETECTED — AB
Respiratory Syncytial Virus: NOT DETECTED

## 2018-01-11 LAB — RAPID URINE DRUG SCREEN, HOSP PERFORMED
Amphetamines: NOT DETECTED
Barbiturates: NOT DETECTED
Benzodiazepines: NOT DETECTED
Cocaine: NOT DETECTED
Opiates: NOT DETECTED
Tetrahydrocannabinol: NOT DETECTED

## 2018-01-11 LAB — EXPECTORATED SPUTUM ASSESSMENT W GRAM STAIN, RFLX TO RESP C

## 2018-01-11 LAB — EXPECTORATED SPUTUM ASSESSMENT W REFEX TO RESP CULTURE

## 2018-01-11 LAB — LEGIONELLA PNEUMOPHILA SEROGP 1 UR AG: L. pneumophila Serogp 1 Ur Ag: NEGATIVE

## 2018-01-11 MED ORDER — GUAIFENESIN ER 600 MG PO TB12
600.0000 mg | ORAL_TABLET | Freq: Two times a day (BID) | ORAL | Status: DC
  Administered 2018-01-11 – 2018-01-15 (×9): 600 mg via ORAL
  Filled 2018-01-11 (×9): qty 1

## 2018-01-11 MED ORDER — IBUPROFEN 600 MG PO TABS
600.0000 mg | ORAL_TABLET | Freq: Once | ORAL | Status: AC
  Administered 2018-01-11: 600 mg via ORAL
  Filled 2018-01-11: qty 1

## 2018-01-11 MED ORDER — SODIUM CHLORIDE 0.9 % IV SOLN
2.0000 g | INTRAVENOUS | Status: DC
  Administered 2018-01-11 – 2018-01-14 (×4): 2 g via INTRAVENOUS
  Filled 2018-01-11 (×5): qty 20

## 2018-01-11 MED ORDER — SODIUM CHLORIDE 0.9 % IV BOLUS
500.0000 mL | Freq: Once | INTRAVENOUS | Status: AC
  Administered 2018-01-11: 500 mL via INTRAVENOUS

## 2018-01-11 MED ORDER — SODIUM CHLORIDE 0.9 % IV SOLN
INTRAVENOUS | Status: AC
  Administered 2018-01-11: 1000 mL via INTRAVENOUS
  Administered 2018-01-12: 03:00:00 via INTRAVENOUS

## 2018-01-11 MED ORDER — SODIUM CHLORIDE 0.9 % IV BOLUS
500.0000 mL | Freq: Once | INTRAVENOUS | Status: AC | PRN
  Administered 2018-01-11: 500 mL via INTRAVENOUS

## 2018-01-11 MED ORDER — IBUPROFEN 400 MG PO TABS
400.0000 mg | ORAL_TABLET | Freq: Once | ORAL | Status: AC
  Administered 2018-01-11: 400 mg via ORAL
  Filled 2018-01-11: qty 1

## 2018-01-11 MED ORDER — GUAIFENESIN-DM 100-10 MG/5ML PO SYRP
5.0000 mL | ORAL_SOLUTION | ORAL | Status: DC | PRN
  Administered 2018-01-11: 5 mL via ORAL
  Filled 2018-01-11: qty 5

## 2018-01-11 NOTE — Progress Notes (Signed)
PROGRESS NOTE        PATIENT DETAILS Name: Maria Owens Age: 22 y.o. Sex: female Date of Birth: 12/31/1995 Admit Date: 01/09/2018 Admitting Physician Lorretta Harp, MD ZOX:[id number], No Pcp Per  Brief Narrative: Patient is a 22 y.o. female with history of childhood asthma-presented to the ED with 3-4-day history of with fever, shortness of breath, cough-found to have acute hypoxic respiratory failure and sepsis secondary to multifocal pneumonia.  See below for further details.  Subjective: Appears weak-but feels better than yesterday.  Lying comfortably in bed-she is not in any sort of distress.  Multiple episodes of coughing while I was in the room.  Assessment/Plan: Acute hypoxic respiratory failure: Secondary to multifocal pneumonia (denies vaping)-continue supportive care with antibiotics, bronchodilators-titrate off oxygen as tolerated.  Although hypoxic and acutely ill-appearing-she is not in any distress-she is not tachypneic, is easily able to talk in full sentences.  Sepsis secondary to multifocal pneumonia: Sepsis physiology is improving-influenza PCR negative, respiratory virus panel positive for rhinovirus and parainfluenza virus, blood cultures negative so far.  Sputum cultures pending but Gram stain positive for gram-positive cocci.  Since she is acutely ill-we will continue vancomycin until sputum cultures are final, will switch from Zosyn to Rocephin and continue with Zithromax for atypical coverage.  Do not think she requires Tamiflu.  If cultures continue to be negative and she continues to improve-suspect we could discontinue vancomycin soon.  Hypokalemia: Replete and recheck.  DVT Prophylaxis: Prophylactic Lovenox  Code Status: Full code  Family Communication: Mother/Father at bedside  Disposition Plan: Remain inpatient- will requires several more days of hospitalization before discharge as she is still acutely ill and  hypoxic.  Antimicrobial agents: Anti-infectives (From admission, onward)   Start     Dose/Rate Route Frequency Ordered Stop   01/11/18 0000  azithromycin (ZITHROMAX) 500 mg in sodium chloride 0.9 % 250 mL IVPB     500 mg 250 mL/hr over 60 Minutes Intravenous Every 24 hours 01/10/18 0953     01/10/18 1730  vancomycin (VANCOCIN) IVPB 750 mg/150 ml premix     750 mg 150 mL/hr over 60 Minutes Intravenous Every 8 hours 01/10/18 0910     01/10/18 1000  piperacillin-tazobactam (ZOSYN) IVPB 3.375 g     3.375 g 12.5 mL/hr over 240 Minutes Intravenous Every 8 hours 01/10/18 0953     01/10/18 1000  oseltamivir (TAMIFLU) capsule 75 mg     75 mg Oral 2 times daily 01/10/18 0916 01/15/18 0959   01/10/18 0915  vancomycin (VANCOCIN) IVPB 1000 mg/200 mL premix     1,000 mg 200 mL/hr over 60 Minutes Intravenous  Once 01/10/18 0910 01/10/18 1217   01/10/18 0330  cefTRIAXone (ROCEPHIN) 1 g in sodium chloride 0.9 % 100 mL IVPB     1 g 200 mL/hr over 30 Minutes Intravenous  Once 01/10/18 0323 01/10/18 0651   01/10/18 0330  azithromycin (ZITHROMAX) 500 mg in sodium chloride 0.9 % 250 mL IVPB     500 mg 250 mL/hr over 60 Minutes Intravenous  Once 01/10/18 0323 01/10/18 0919      Procedures: None  CONSULTS:  pulmonary/intensive care  Time spent: 35 minutes-Greater than 50% of this time was spent in counseling, explanation of diagnosis, planning of further management, and coordination of care.  MEDICATIONS: Scheduled Meds: . AEROCHAMBER PLUS FLO-VU LARGE  1 each Other Once  .  enoxaparin (LOVENOX) injection  40 mg Subcutaneous Q24H  . oseltamivir  75 mg Oral BID   Continuous Infusions: . azithromycin 500 mg (01/11/18 0018)  . piperacillin-tazobactam (ZOSYN)  IV 3.375 g (01/11/18 0536)  . vancomycin 750 mg (01/11/18 0933)   PRN Meds:.acetaminophen, benzonatate, guaiFENesin-dextromethorphan, levalbuterol, phenol   PHYSICAL EXAM: Vital signs: Vitals:   01/11/18 0511 01/11/18 0546 01/11/18  0621 01/11/18 0800  BP: 113/71   110/64  Pulse: (!) 132   (!) 101  Resp: (!) 26   (!) 23  Temp:  (!) 102 F (38.9 C) 100.2 F (37.9 C) 98.9 F (37.2 C)  TempSrc:  Oral Oral Oral  SpO2: 99%   100%  Weight:      Height:       Filed Weights   01/09/18 2112 01/10/18 0508 01/10/18 1250  Weight: 54 kg 56.9 kg 58.5 kg   Body mass index is 20.82 kg/m.   General appearance :Awake, alert, not in any distress.  HEENT: Atraumatic and Normocephalic Neck: supple, no JVD. No cervical lymphadenopathy. No thyromegaly Resp:Good air entry bilaterally, diffuse rales up to mid lung CVS: S1 S2 regular, no murmurs.  GI: Bowel sounds present, Non tender and not distended with no gaurding, rigidity or rebound.No organomegaly Extremities: B/L Lower Ext shows no edema, both legs are warm to touch Neurology:  speech clear,Non focal, sensation is grossly intact. Psychiatric: Normal judgment and insight. Alert and oriented x 3. Musculoskeletal:No digital cyanosis Skin:No Rash, warm and dry Wounds:N/A  I have personally reviewed following labs and imaging studies  LABORATORY DATA: CBC: Recent Labs  Lab 01/09/18 2118 01/10/18 0956  WBC 6.8 3.8*  HGB 13.1 10.8*  HCT 41.1 33.2*  MCV 93.8 94.3  PLT 281 192    Basic Metabolic Panel: Recent Labs  Lab 01/09/18 2118 01/10/18 0956 01/11/18 0519  NA 138  --  137  K 3.7  --  3.4*  CL 102  --  110  CO2 24  --  21*  GLUCOSE 119*  --  112*  BUN 7  --  <5*  CREATININE 0.74 0.77 0.67  CALCIUM 9.5  --  7.9*  MG  --  1.3*  --   PHOS  --  2.0*  --     GFR: Estimated Creatinine Clearance: 104.5 mL/min (by C-G formula based on SCr of 0.67 mg/dL).  Liver Function Tests: Recent Labs  Lab 01/11/18 0519  AST 19  ALT 13  ALKPHOS 45  BILITOT 0.8  PROT 5.3*  ALBUMIN 2.5*   No results for input(s): LIPASE, AMYLASE in the last 168 hours. No results for input(s): AMMONIA in the last 168 hours.  Coagulation Profile: No results for input(s): INR,  PROTIME in the last 168 hours.  Cardiac Enzymes: No results for input(s): CKTOTAL, CKMB, CKMBINDEX, TROPONINI in the last 168 hours.  BNP (last 3 results) No results for input(s): PROBNP in the last 8760 hours.  HbA1C: No results for input(s): HGBA1C in the last 72 hours.  CBG: No results for input(s): GLUCAP in the last 168 hours.  Lipid Profile: No results for input(s): CHOL, HDL, LDLCALC, TRIG, CHOLHDL, LDLDIRECT in the last 72 hours.  Thyroid Function Tests: Recent Labs    01/10/18 0956  TSH 0.548  FREET4 0.99    Anemia Panel: No results for input(s): VITAMINB12, FOLATE, FERRITIN, TIBC, IRON, RETICCTPCT in the last 72 hours.  Urine analysis: No results found for: COLORURINE, APPEARANCEUR, LABSPEC, PHURINE, GLUCOSEU, HGBUR, BILIRUBINUR, KETONESUR, PROTEINUR, UROBILINOGEN, NITRITE, LEUKOCYTESUR  Sepsis Labs: Lactic Acid, Venous    Component Value Date/Time   LATICACIDVEN 1.3 01/10/2018 2241    MICROBIOLOGY: Recent Results (from the past 240 hour(s))  Culture, blood (routine x 2) Call MD if unable to obtain prior to antibiotics being given     Status: None (Preliminary result)   Collection Time: 01/10/18  5:45 AM  Result Value Ref Range Status   Specimen Description BLOOD RIGHT ANTECUBITAL  Final   Special Requests   Final    BOTTLES DRAWN AEROBIC AND ANAEROBIC Blood Culture adequate volume   Culture   Final    NO GROWTH 1 DAY Performed at [facility], [address] [place], [place] [address]    Report Status PENDING  Incomplete  Culture, blood (routine x 2) Call MD if unable to obtain prior to antibiotics being given     Status: None (Preliminary result)   Collection Time: 01/10/18  5:46 AM  Result Value Ref Range Status   Specimen Description BLOOD BLOOD RIGHT HAND  Final   Special Requests   Final    BOTTLES DRAWN AEROBIC AND ANAEROBIC Blood Culture results may not be optimal due to an inadequate volume of blood received in culture bottles    Culture   Final    NO GROWTH 1 DAY Performed at [facility], [address], [place], [place] [address]    Report Status PENDING  Incomplete  Culture, sputum-assessment     Status: None   Collection Time: 01/10/18 11:35 PM  Result Value Ref Range Status   Specimen Description EXPECTORATED SPUTUM  Final   Special Requests NONE  Final   Sputum evaluation   Final    THIS SPECIMEN IS ACCEPTABLE FOR SPUTUM CULTURE Performed at [facility], [address] [place], [place] [address]    Report Status 01/11/2018 FINAL  Final  Culture, respiratory     Status: None (Preliminary result)   Collection Time: 01/10/18 11:35 PM  Result Value Ref Range Status   Specimen Description EXPECTORATED SPUTUM  Final   Special Requests NONE Reflexed from [id number]  Final   Gram Stain   Final    ABUNDANT WBC PRESENT,BOTH PMN AND MONONUCLEAR RARE GRAM POSITIVE COCCI Performed at [facility], [address], [place], [place] [address]    Culture PENDING  Incomplete   Report Status PENDING  Incomplete  Respiratory Panel by PCR     Status: Abnormal   Collection Time: 01/11/18  2:26 AM  Result Value Ref Range Status   Adenovirus NOT DETECTED NOT DETECTED Final   Coronavirus 229E NOT DETECTED NOT DETECTED Final   Coronavirus HKU1 NOT DETECTED NOT DETECTED Final   Coronavirus NL63 NOT DETECTED NOT DETECTED Final   Coronavirus OC43 NOT DETECTED NOT DETECTED Final   Metapneumovirus NOT DETECTED NOT DETECTED Final   Rhinovirus / Enterovirus DETECTED (A) NOT DETECTED Final   Influenza A NOT DETECTED NOT DETECTED Final   Influenza B NOT DETECTED NOT DETECTED Final   Parainfluenza Virus 1 DETECTED (A) NOT DETECTED Final   Parainfluenza Virus 2 NOT DETECTED NOT DETECTED Final   Parainfluenza Virus 3 NOT DETECTED NOT DETECTED Final   Parainfluenza Virus 4 NOT DETECTED NOT DETECTED Final   Respiratory Syncytial Virus NOT DETECTED NOT DETECTED Final   Bordetella pertussis NOT DETECTED NOT DETECTED  Final   Chlamydophila pneumoniae NOT DETECTED NOT DETECTED Final   Mycoplasma pneumoniae NOT DETECTED NOT DETECTED Final    Comment: Performed at  [facility], [address] [place], [place] [address]    RADIOLOGY STUDIES/RESULTS: Ct Angio Chest Pe W And/or Wo Contrast  Result Date: 01/10/2018 CLINICAL DATA:  PE suspected, high prob, normal CXR. Shortness of breath. Cough. EXAM: CT ANGIOGRAPHY CHEST WITH CONTRAST TECHNIQUE: Multidetector CT imaging of the chest was performed using the standard protocol during bolus administration of intravenous contrast. Multiplanar CT image reconstructions and MIPs were obtained to evaluate the vascular anatomy. CONTRAST:  ISOVUE-370 IOPAMIDOL (ISOVUE-370) INJECTION 76% COMPARISON:  Chest radiograph yesterday. FINDINGS: Cardiovascular: There are no filling defects within the pulmonary arteries to suggest pulmonary embolus. Thoracic aorta is normal in caliber. Heart is normal in size. No pericardial effusion. Mediastinum/Nodes: Shotty bilateral hilar adenopathy with nodes measuring 11 mm on the left and 12 mm on the right. Shotty mediastinal lymph nodes all subcentimeter. 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 Esophagus is nondistended. No visualized thyroid nodule. Lungs/Pleura: Multifocal consolidative and ground-glass opacities throughout both lungs, more prominent on the left and greatest in the left lower lobe. There is moderate central bronchial thickening. No significant pleural fluid. Upper Abdomen: No acute abnormality. Musculoskeletal: There are no acute or suspicious osseous abnormalities. Review of the MIP images confirms the above findings. IMPRESSION: 1. No pulmonary embolus. 2. Multifocal consolidations throughout both lungs, suspicious for pneumonia. Findings can also be seen with vaping-related lung injury, recommend clinical correlation. 3. Moderate central bronchial wall thickening may be infectious or inflammatory such as  asthma. Electronically Signed   By: Narda Rutherford M.D.   On: 01/10/2018 03:12   Dg Chest Port 1 View  Result Date: 01/10/2018 CLINICAL DATA:  Shortness of breath EXAM: PORTABLE CHEST 1 VIEW COMPARISON:  01/09/2018, CT chest 01/10/2018 FINDINGS: Heart size within normal limits. Interval worsening of multifocal consolidations and ground-glass opacity with dense consolidation at the left base. No pneumothorax. IMPRESSION: Significant interval radiographic worsening of multifocal bilateral left greater than right consolidations and ground-glass densities, differential of which includes multifocal pneumonia, pulmonary hemorrhage, and inhalation injury. Electronically Signed   By: Jasmine Pang M.D.   On: 01/10/2018 22:02   Dg Chest Port 1 View  Result Date: 01/09/2018 CLINICAL DATA:  Shortness of breath EXAM: PORTABLE CHEST 1 VIEW COMPARISON:  None. FINDINGS: The heart size and mediastinal contours are within normal limits. Both lungs are clear. The visualized skeletal structures are unremarkable. IMPRESSION: No active disease. Electronically Signed   By: Deatra Robinson M.D.   On: 01/09/2018 22:55     LOS: 1 day   Jeoffrey Massed, MD  [facility]  If 7PM-7AM, please contact night-coverage  Please page via [email or web address] [email or web address]-click on MD name and type text message  01/11/2018, 11:18 AM

## 2018-01-11 NOTE — Progress Notes (Signed)
Patient's temp this morning was 103- PRN tylenol given, temp only came down to 102 after an hour. MD on call made aware.   Pt received 500cc bolus and ice packs.

## 2018-01-11 NOTE — Progress Notes (Signed)
Patient refused MRSA screening.

## 2018-01-11 NOTE — Progress Notes (Signed)
One time dose of ibuprofen given. Will pass along to day to recheck temp. Pt is resting at this time

## 2018-01-11 NOTE — Progress Notes (Signed)
Charge RN spoke with on call MD about patient refusing ABG. This RN reported that temp had come down and lactic had decrease to 1.3. Further lactic cancelled.

## 2018-01-11 NOTE — Progress Notes (Signed)
This afternoon patient's temp came up to 100.4; Patient received Tylenol 650 mg and after about 1 hr, temp was still high 101. MD made aware. MD recommended to wait about 30 minutes and recheck temp again. At 17:44 temp was 98.9. Will continue to monitor.

## 2018-01-11 NOTE — Progress Notes (Signed)
ALerted by [facility] staff about patient with MEWS score of 6 per MEWS pilot.  On assessment patient alert - sitting up in bed - NAD - resps rapid 28-30 but without accessory muscle use.  Bil BS clear - no wheezing noted.  Patient denies SOB - or pain. Able to speak full sentences.  ST noted on monitor.  BP 103/52 HR 137 Oral temp 100.9. RA O2 sats 93-95%.  Dr. Dartha Lodge at bedside.  Rodney Booze RN at bedside.  MD to write orders.  Will follow as needed.  Follow Up:  Patient to transfer to SDU status.  Difficult ABG draw - patient now emotional - crying stating she is scared.  Reassurance given - spoke with mother over phone who is traveling here now from [place].  Questions answered.  Follow Up:  1200:  ABG resulted pO2 47 - placed on 4 liters nasal cannula per RT - remains NAD.  Spiking fever again.  Follow Up:  1730 - now on 5W - family at bedside - sitting up eating dinner - NAD - smiling - remains with ST and RR 30's - again without accessory muscle use - denies SOB - states she feels better.  Will continue to follow.

## 2018-01-11 NOTE — Consult Note (Signed)
PULMONARY / CRITICAL CARE MEDICINE   NAME:  Maria Owens, MRN:  [id number], DOB:  [birth year], LOS: 1 ADMISSION DATE:  01/09/2018, CONSULTATION DATE:  REFERRING MD: Rapid response RN, CHIEF COMPLAINT: Respiratory distress, Hypotension  BRIEF HISTORY:    Pneumonia HISTORY OF PRESENT ILLNESS   21yoF with hx Asthma, admitted 10/18 AM with T:103.2, SOB, Cough x 1 day. Patient admitted to hospitalist service and started on broad spectrum antibiotics. Over the course of the evening, patient's BP began dropping, from SBP 130's during the day to now a low of 86/42. Patient has been persistently tachycardic into 110-130's and tachypneic into high 20's-low 30's since admission. Rapid response RN now asks for PCCM consult.   At time of my exam, patient was sleeping (2am) but denies SOB, Wheezing, CP. Admits to productive cough.   SIGNIFICANT PAST MEDICAL HISTORY   See above   SIGNIFICANT EVENTS:  10/18 PM: worsening hypotension; continued tachypnea and tachycardia  STUDIES:   CTA Chest (10/18):  1. No pulmonary embolus. 2. Multifocal consolidations throughout both lungs, suspicious for pneumonia. Findings can also be seen with vaping-related lung injury, recommend clinical correlation. 3. Moderate central bronchial wall thickening may be infectious or inflammatory such as asthma.  CULTURES:  Blood cultures (10/18): pending Sputum culture (10/18): pending   ANTIBIOTICS:  Ceftriaxone 10/18 Azithromycin 10/18>> Zosyn 10/18>> Vancomycin 10/18>>  LINES/TUBES:  PIV's CONSULTANTS:  PCCM SUBJECTIVE:  Lying in bed trying to sleep, in NAD  CONSTITUTIONAL: BP (!) 86/42 Comment: bolus started   Pulse (!) 118   Temp 98.9 F (37.2 C) (Oral)   Resp (!) 30   Ht 5\' 6"  (1.676 m)   Wt 58.5 kg   SpO2 98%   BMI 20.82 kg/m   I/O last 3 completed shifts: In: 4371.2 [P.O.:240; I.V.:731.3; IV Piggyback:3400] Out: -      PHYSICAL EXAM: General: WDWN Young adult female, lying in bed, in  NAD Neuro: AAOx3, moving all extremities HEENT: OP clear, MM moist  Cardiovascular: Sinus tachycardia, no m/r/g Lungs: CTA b/l, RR 25-32 but no accessory muscle use. Speaking in full sentences.  Abdomen: Soft NTND Musculoskeletal: no LE edema Skin: no rashes   RESOLVED PROBLEM LIST   ASSESSMENT AND PLAN   21yoF with hx Asthma, admit with CAP  1. CAP; Acute Hypoxic Respiratory failure: - CTA Chest on my review shows multifocal pulmonary infiltrates - blood cultures and sputum culture pending; elevated procal will continue to trend. Lactate initially high but now improved.  - Urine strep Ag negative. Flu Ag negative. Order respiratory viral panel.  - TTE pending - SBP 86 but BP cuff was too large; with appropriately sized BP cuff, SBP now 105. Patient is somewhat tachypneic but is not having any accessory muscle use and is speaking in full sentences. Therefore there is no indication for transfer to ICU at this time. Continue close monitoring in SDU. Please reconsult PCCM if patient decompensates in any way.   SUMMARY OF TODAY'S PLAN:  See above   Best Practice / Goals of Care / Disposition.   DVT PROPHYLAXIS: Lovenox SUP: NA NUTRITION: Regular MOBILITY: with assistance GOALS OF CARE: FULL CODE FAMILY DISCUSSIONS: updated patient and her family in stepdown unit DISPOSITION   LABS  Glucose No results for input(s): GLUCAP in the last 168 hours.  BMET Recent Labs  Lab 01/09/18 2118 01/10/18 0956  NA 138  --   K 3.7  --   CL 102  --   CO2 24  --  BUN 7  --   CREATININE 0.74 0.77  GLUCOSE 119*  --    Liver Enzymes No results for input(s): AST, ALT, ALKPHOS, BILITOT, ALBUMIN in the last 168 hours.  Electrolytes Recent Labs  Lab 01/09/18 2118 01/10/18 0956  CALCIUM 9.5  --   MG  --  1.3*  PHOS  --  2.0*   CBC Recent Labs  Lab 01/09/18 2118 01/10/18 0956  WBC 6.8 3.8*  HGB 13.1 10.8*  HCT 41.1 33.2*  PLT 281 192   ABG Recent Labs  Lab 01/10/18 1131   PHART 7.441  PCO2ART 27.8*  PO2ART 47.3*   Coag's No results for input(s): APTT, INR in the last 168 hours.  Sepsis Markers Recent Labs  Lab 01/09/18 2142 01/10/18 0540 01/10/18 0956 01/10/18 2241  LATICACIDVEN 2.23* 3.5*  --  1.3  PROCALCITON  --   --  8.83  --    Cardiac Enzymes No results for input(s): TROPONINI, PROBNP in the last 168 hours.  PAST MEDICAL HISTORY :   She  has no past medical history on file.  PAST SURGICAL HISTORY:  She  has a past surgical history that includes IUD removal.  No Known Allergies  No current facility-administered medications on file prior to encounter.    Current Outpatient Medications on File Prior to Encounter  Medication Sig  . Acetaminophen-DM (VICKS DAYQUIL HBP COLD & FLU) 325-10 MG CAPS Take 1 capsule by mouth 2 (two) times daily as needed (for cough).   FAMILY HISTORY:   Her family history is not on file.  SOCIAL HISTORY:  She  reports that she has never smoked. She has never used smokeless tobacco. She reports that she drinks alcohol. She reports that she does not use drugs.  REVIEW OF SYSTEMS:    Review of Systems  Constitutional: Positive for fever. Negative for chills, diaphoresis, malaise/fatigue and weight loss.  HENT: Negative.   Eyes: Negative.   Respiratory: Positive for cough and sputum production. Negative for hemoptysis, shortness of breath and wheezing.   Cardiovascular: Negative.   Gastrointestinal: Negative.   Genitourinary: Negative.   Musculoskeletal: Negative.   Skin: Negative.   Neurological: Negative.   Endo/Heme/Allergies: Negative.   Psychiatric/Behavioral: Negative.    45 minutes critical care time  Milana Obey, MD Pulmonary & Critical Care Medicine Pager: [phone]

## 2018-01-12 ENCOUNTER — Inpatient Hospital Stay (HOSPITAL_COMMUNITY): Payer: BLUE CROSS/BLUE SHIELD

## 2018-01-12 DIAGNOSIS — I361 Nonrheumatic tricuspid (valve) insufficiency: Secondary | ICD-10-CM

## 2018-01-12 LAB — BASIC METABOLIC PANEL
ANION GAP: 7 (ref 5–15)
BUN: <5 mg/dL — ABNORMAL LOW (ref 6–20)
CALCIUM: 8 mg/dL — AB (ref 8.9–10.3)
CO2: 21 mmol/L — ABNORMAL LOW (ref 22–32)
Chloride: 111 mmol/L (ref 98–111)
Creatinine, Ser: 0.57 mg/dL (ref 0.44–1.00)
GFR calc Af Amer: >60 mL/min (ref >60)
GFR calc non Af Amer: >60 mL/min (ref >60)
GLUCOSE: 104 mg/dL — AB (ref 70–99)
POTASSIUM: 3.2 mmol/L — AB (ref 3.5–5.1)
Sodium: 139 mmol/L (ref 135–145)

## 2018-01-12 LAB — CBC
HEMATOCRIT: 30.4 % — AB (ref 36.0–46.0)
Hemoglobin: 9.9 g/dL — ABNORMAL LOW (ref 12.0–15.0)
MCH: 30.6 pg (ref 26.0–34.0)
MCHC: 32.6 g/dL (ref 30.0–36.0)
MCV: 93.8 fL (ref 80.0–100.0)
NRBC: 0 % (ref 0.0–0.2)
Platelets: 246 10*3/uL (ref 150–400)
RBC: 3.24 MIL/uL — ABNORMAL LOW (ref 3.87–5.11)
RDW: 13.1 % (ref 11.5–15.5)
WBC: 7.9 10*3/uL (ref 4.0–10.5)

## 2018-01-12 LAB — ECHOCARDIOGRAM COMPLETE
Height: 66 in
Weight: 2063.51 oz

## 2018-01-12 LAB — MAGNESIUM: Magnesium: 1.8 mg/dL (ref 1.7–2.4)

## 2018-01-12 LAB — PHOSPHORUS: Phosphorus: 1.4 mg/dL — ABNORMAL LOW (ref 2.5–4.6)

## 2018-01-12 LAB — PROCALCITONIN: Procalcitonin: 3.81 ng/mL

## 2018-01-12 MED ORDER — POTASSIUM PHOSPHATES 15 MMOLE/5ML IV SOLN
20.0000 meq | Freq: Once | INTRAVENOUS | Status: AC
  Administered 2018-01-12: 20 meq via INTRAVENOUS
  Filled 2018-01-12: qty 4.55

## 2018-01-12 MED ORDER — SODIUM CHLORIDE 0.9 % IV SOLN
INTRAVENOUS | Status: AC
  Administered 2018-01-12: 1000 mL via INTRAVENOUS

## 2018-01-12 MED ORDER — IBUPROFEN 400 MG PO TABS
400.0000 mg | ORAL_TABLET | Freq: Four times a day (QID) | ORAL | Status: DC | PRN
  Administered 2018-01-12: 400 mg via ORAL
  Filled 2018-01-12: qty 1

## 2018-01-12 MED ORDER — POTASSIUM CHLORIDE CRYS ER 20 MEQ PO TBCR
40.0000 meq | EXTENDED_RELEASE_TABLET | Freq: Once | ORAL | Status: AC
  Administered 2018-01-12: 40 meq via ORAL
  Filled 2018-01-12: qty 2

## 2018-01-12 MED ORDER — MAGNESIUM SULFATE IN D5W 1-5 GM/100ML-% IV SOLN
1.0000 g | Freq: Once | INTRAVENOUS | Status: AC
  Administered 2018-01-12: 1 g via INTRAVENOUS
  Filled 2018-01-12: qty 100

## 2018-01-12 NOTE — Progress Notes (Signed)
  Echocardiogram 2D Echocardiogram has been performed.  Maria Owens 01/12/2018, 1:11 PM

## 2018-01-12 NOTE — Progress Notes (Signed)
PROGRESS NOTE        PATIENT DETAILS Name: Maria Owens Age: 22 y.o. Sex: female Date of Birth: 1995/12/12 Admit Date: 01/09/2018 Admitting Physician Lorretta Harp, MD ZOX:[id number], No Pcp Per  Brief Narrative: Patient is a 22 y.o. female with history of childhood asthma-presented to the ED with 3-4-day history of with fever, shortness of breath, cough-found to have acute hypoxic respiratory failure and sepsis secondary to multifocal pneumonia.  See below for further details.  Subjective: Appears weak-claims she feels slightly better today.  Continues to cough-no shortness of breath at rest-but apparently gets short winded when she ambulates to the bathroom.  Assessment/Plan: Acute hypoxic respiratory failure: Secondary to multifocal pneumonia (denies vaping)-very slowly improving-continue with empiric antibiotics, bronchodilators, oxygen titrated down to 3 L today.  Although hypoxic-she is not in any distress-she is not tachypneic and is easily talking in full sentences.  Continue to monitor closely.    Sepsis secondary to multifocal pneumonia: Possibly viral pneumonitis as respiratory virus panel positive for rhinovirus and parainfluenza virus.  However sputum-Gram stain showing gram-positive cocci, cultures are pending.  Although feels somewhat better-continues to have fever and sinus tachycardia.  She is otherwise stable.  Remains on empiric vancomycin, Rocephin and Zithromax.  Blood cultures negative so far. Trend procalcitonin-follow closely-and slowly narrow antimicrobial epi depending on clinical response.  Hypokalemia: Replete and recheck-have ordered magnesium/phosphorus levels as well.  DVT Prophylaxis: Prophylactic Lovenox  Code Status: Full code  Family Communication: None at bedside  Disposition Plan: Remain inpatient- will requires several more days of hospitalization before discharge as she is still acutely ill and hypoxic.  Antimicrobial  agents: Anti-infectives (From admission, onward)   Start     Dose/Rate Route Frequency Ordered Stop   01/11/18 1200  cefTRIAXone (ROCEPHIN) 2 g in sodium chloride 0.9 % 100 mL IVPB     2 g 200 mL/hr over 30 Minutes Intravenous Every 24 hours 01/11/18 1122     01/11/18 0000  azithromycin (ZITHROMAX) 500 mg in sodium chloride 0.9 % 250 mL IVPB     500 mg 250 mL/hr over 60 Minutes Intravenous Every 24 hours 01/10/18 0953     01/10/18 1730  vancomycin (VANCOCIN) IVPB 750 mg/150 ml premix     750 mg 150 mL/hr over 60 Minutes Intravenous Every 8 hours 01/10/18 0910     01/10/18 1000  piperacillin-tazobactam (ZOSYN) IVPB 3.375 g  Status:  Discontinued     3.375 g 12.5 mL/hr over 240 Minutes Intravenous Every 8 hours 01/10/18 0953 01/11/18 1122   01/10/18 1000  oseltamivir (TAMIFLU) capsule 75 mg  Status:  Discontinued     75 mg Oral 2 times daily 01/10/18 0916 01/11/18 1122   01/10/18 0915  vancomycin (VANCOCIN) IVPB 1000 mg/200 mL premix     1,000 mg 200 mL/hr over 60 Minutes Intravenous  Once 01/10/18 0910 01/10/18 1217   01/10/18 0330  cefTRIAXone (ROCEPHIN) 1 g in sodium chloride 0.9 % 100 mL IVPB     1 g 200 mL/hr over 30 Minutes Intravenous  Once 01/10/18 0323 01/10/18 0651   01/10/18 0330  azithromycin (ZITHROMAX) 500 mg in sodium chloride 0.9 % 250 mL IVPB     500 mg 250 mL/hr over 60 Minutes Intravenous  Once 01/10/18 0323 01/10/18 0919      Procedures: None  CONSULTS:  pulmonary/intensive care  Time spent: 35  minutes-Greater than 50% of this time was spent in counseling, explanation of diagnosis, planning of further management, and coordination of care.  MEDICATIONS: Scheduled Meds: . AEROCHAMBER PLUS FLO-VU LARGE  1 each Other Once  . enoxaparin (LOVENOX) injection  40 mg Subcutaneous Q24H  . guaiFENesin  600 mg Oral BID   Continuous Infusions: . sodium chloride 75 mL/hr at 01/12/18 0327  . azithromycin Stopped (01/12/18 0243)  . cefTRIAXone (ROCEPHIN)  IV 2 g  (01/11/18 1248)  . vancomycin 750 mg (01/12/18 0932)   PRN Meds:.acetaminophen, benzonatate, levalbuterol, phenol   PHYSICAL EXAM: Vital signs: Vitals:   01/12/18 0526 01/12/18 0745 01/12/18 0856 01/12/18 0909  BP: 117/73 133/89    Pulse: (!) 107 (!) 113    Resp: (!) 29 (!) 23    Temp: 99.8 F (37.7 C) 100.3 F (37.9 C) 100.3 F (37.9 C) 99.1 F (37.3 C)  TempSrc:  Oral Oral Oral  SpO2: 100% 100%    Weight:      Height:       Filed Weights   01/09/18 2112 01/10/18 0508 01/10/18 1250  Weight: 54 kg 56.9 kg 58.5 kg   Body mass index is 20.82 kg/m.   General appearance:Awake, alert, not in any distress.  Appears weak.  Not in any acute distress. Eyes:no scleral icterus. HEENT: Atraumatic and Normocephalic Neck: supple, no JVD. Resp:Good air entry bilaterally, few bibasilar rales. CVS: S1 S2 regular, no murmurs.  GI: Bowel sounds present, Non tender and not distended with no gaurding, rigidity or rebound. Extremities: B/L Lower Ext shows no edema, both legs are warm to touch Neurology:  Non focal Psychiatric: Normal judgment and insight. Normal mood. Musculoskeletal:No digital cyanosis Skin:No Rash, warm and dry Wounds:N/A  I have personally reviewed following labs and imaging studies  LABORATORY DATA: CBC: Recent Labs  Lab 01/09/18 2118 01/10/18 0956 01/12/18 0254  WBC 6.8 3.8* 7.9  HGB 13.1 10.8* 9.9*  HCT 41.1 33.2* 30.4*  MCV 93.8 94.3 93.8  PLT 281 192 246    Basic Metabolic Panel: Recent Labs  Lab 01/09/18 2118 01/10/18 0956 01/11/18 0519 01/12/18 0254  NA 138  --  137 139  K 3.7  --  3.4* 3.2*  CL 102  --  110 111  CO2 24  --  21* 21*  GLUCOSE 119*  --  112* 104*  BUN 7  --  <5* <5*  CREATININE 0.74 0.77 0.67 0.57  CALCIUM 9.5  --  7.9* 8.0*  MG  --  1.3*  --   --   PHOS  --  2.0*  --   --     GFR: Estimated Creatinine Clearance: 104.5 mL/min (by C-G formula based on SCr of 0.57 mg/dL).  Liver Function Tests: Recent Labs  Lab  01/11/18 0519  AST 19  ALT 13  ALKPHOS 45  BILITOT 0.8  PROT 5.3*  ALBUMIN 2.5*   No results for input(s): LIPASE, AMYLASE in the last 168 hours. No results for input(s): AMMONIA in the last 168 hours.  Coagulation Profile: No results for input(s): INR, PROTIME in the last 168 hours.  Cardiac Enzymes: No results for input(s): CKTOTAL, CKMB, CKMBINDEX, TROPONINI in the last 168 hours.  BNP (last 3 results) No results for input(s): PROBNP in the last 8760 hours.  HbA1C: No results for input(s): HGBA1C in the last 72 hours.  CBG: No results for input(s): GLUCAP in the last 168 hours.  Lipid Profile: No results for input(s): CHOL, HDL, LDLCALC, TRIG, CHOLHDL,  LDLDIRECT in the last 72 hours.  Thyroid Function Tests: Recent Labs    01/10/18 0956  TSH 0.548  FREET4 0.99    Anemia Panel: No results for input(s): VITAMINB12, FOLATE, FERRITIN, TIBC, IRON, RETICCTPCT in the last 72 hours.  Urine analysis:    Component Value Date/Time   COLORURINE STRAW (A) 01/11/2018 1325   APPEARANCEUR CLEAR 01/11/2018 1325   LABSPEC 1.005 01/11/2018 1325   PHURINE 7.0 01/11/2018 1325   GLUCOSEU NEGATIVE 01/11/2018 1325   HGBUR NEGATIVE 01/11/2018 1325   BILIRUBINUR NEGATIVE 01/11/2018 1325   KETONESUR NEGATIVE 01/11/2018 1325   PROTEINUR NEGATIVE 01/11/2018 1325   NITRITE NEGATIVE 01/11/2018 1325   LEUKOCYTESUR NEGATIVE 01/11/2018 1325    Sepsis Labs: Lactic Acid, Venous    Component Value Date/Time   LATICACIDVEN 1.3 01/10/2018 2241    MICROBIOLOGY: Recent Results (from the past 240 hour(s))  Culture, blood (routine x 2) Call MD if unable to obtain prior to antibiotics being given     Status: None (Preliminary result)   Collection Time: 01/10/18  5:45 AM  Result Value Ref Range Status   Specimen Description BLOOD RIGHT ANTECUBITAL  Final   Special Requests   Final    BOTTLES DRAWN AEROBIC AND ANAEROBIC Blood Culture adequate volume   Culture   Final    NO GROWTH 1  DAY Performed at [facility], [address], [place], [place] [address]    Report Status PENDING  Incomplete  Culture, blood (routine x 2) Call MD if unable to obtain prior to antibiotics being given     Status: None (Preliminary result)   Collection Time: 01/10/18  5:46 AM  Result Value Ref Range Status   Specimen Description BLOOD BLOOD RIGHT HAND  Final   Special Requests   Final    BOTTLES DRAWN AEROBIC AND ANAEROBIC Blood Culture results may not be optimal due to an inadequate volume of blood received in culture bottles   Culture   Final    NO GROWTH 1 DAY Performed at [facility], [address], [place], [place] [address]    Report Status PENDING  Incomplete  Culture, sputum-assessment     Status: None   Collection Time: 01/10/18 11:35 PM  Result Value Ref Range Status   Specimen Description EXPECTORATED SPUTUM  Final   Special Requests NONE  Final   Sputum evaluation   Final    THIS SPECIMEN IS ACCEPTABLE FOR SPUTUM CULTURE Performed at [facility], [address], [place], [place] [address]    Report Status 01/11/2018 FINAL  Final  Culture, respiratory     Status: None (Preliminary result)   Collection Time: 01/10/18 11:35 PM  Result Value Ref Range Status   Specimen Description EXPECTORATED SPUTUM  Final   Special Requests NONE Reflexed from [id number]  Final   Gram Stain   Final    ABUNDANT WBC PRESENT,BOTH PMN AND MONONUCLEAR RARE GRAM POSITIVE COCCI Performed at [facility], [address], [place], [place] [address]    Culture PENDING  Incomplete   Report Status PENDING  Incomplete  Respiratory Panel by PCR     Status: Abnormal   Collection Time: 01/11/18  2:26 AM  Result Value Ref Range Status   Adenovirus NOT DETECTED NOT DETECTED Final   Coronavirus 229E NOT DETECTED NOT DETECTED Final   Coronavirus HKU1 NOT DETECTED NOT DETECTED Final   Coronavirus NL63 NOT DETECTED NOT DETECTED Final   Coronavirus OC43 NOT DETECTED NOT  DETECTED  Final   Metapneumovirus NOT DETECTED NOT DETECTED Final   Rhinovirus / Enterovirus DETECTED (A) NOT DETECTED Final   Influenza A NOT DETECTED NOT DETECTED Final   Influenza B NOT DETECTED NOT DETECTED Final   Parainfluenza Virus 1 DETECTED (A) NOT DETECTED Final   Parainfluenza Virus 2 NOT DETECTED NOT DETECTED Final   Parainfluenza Virus 3 NOT DETECTED NOT DETECTED Final   Parainfluenza Virus 4 NOT DETECTED NOT DETECTED Final   Respiratory Syncytial Virus NOT DETECTED NOT DETECTED Final   Bordetella pertussis NOT DETECTED NOT DETECTED Final   Chlamydophila pneumoniae NOT DETECTED NOT DETECTED Final   Mycoplasma pneumoniae NOT DETECTED NOT DETECTED Final    Comment: Performed at [facility], [address], [place], [place] [address]    RADIOLOGY STUDIES/RESULTS: Ct Angio Chest Pe W And/or Wo Contrast  Result Date: 01/10/2018 CLINICAL DATA:  PE suspected, high prob, normal CXR. Shortness of breath. Cough. EXAM: CT ANGIOGRAPHY CHEST WITH CONTRAST TECHNIQUE: Multidetector CT imaging of the chest was performed using the standard protocol during bolus administration of intravenous contrast. Multiplanar CT image reconstructions and MIPs were obtained to evaluate the vascular anatomy. CONTRAST:  ISOVUE-370 IOPAMIDOL (ISOVUE-370) INJECTION 76% COMPARISON:  Chest radiograph yesterday. FINDINGS: Cardiovascular: There are no filling defects within the pulmonary arteries to suggest pulmonary embolus. Thoracic aorta is normal in caliber. Heart is normal in size. No pericardial effusion. Mediastinum/Nodes: Shotty bilateral hilar adenopathy with nodes measuring 11 mm on the left and 12 mm on the right. Shotty mediastinal lymph nodes all subcentimeter. 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 Esophagus is nondistended. No visualized thyroid nodule. Lungs/Pleura: Multifocal consolidative and ground-glass opacities throughout both lungs, more prominent on the left and greatest in the  left lower lobe. There is moderate central bronchial thickening. No significant pleural fluid. Upper Abdomen: No acute abnormality. Musculoskeletal: There are no acute or suspicious osseous abnormalities. Review of the MIP images confirms the above findings. IMPRESSION: 1. No pulmonary embolus. 2. Multifocal consolidations throughout both lungs, suspicious for pneumonia. Findings can also be seen with vaping-related lung injury, recommend clinical correlation. 3. Moderate central bronchial wall thickening may be infectious or inflammatory such as asthma. Electronically Signed   By: Narda Rutherford M.D.   On: 01/10/2018 03:12   Dg Chest Port 1 View  Result Date: 01/10/2018 CLINICAL DATA:  Shortness of breath EXAM: PORTABLE CHEST 1 VIEW COMPARISON:  01/09/2018, CT chest 01/10/2018 FINDINGS: Heart size within normal limits. Interval worsening of multifocal consolidations and ground-glass opacity with dense consolidation at the left base. No pneumothorax. IMPRESSION: Significant interval radiographic worsening of multifocal bilateral left greater than right consolidations and ground-glass densities, differential of which includes multifocal pneumonia, pulmonary hemorrhage, and inhalation injury. Electronically Signed   By: Jasmine Pang M.D.   On: 01/10/2018 22:02   Dg Chest Port 1 View  Result Date: 01/09/2018 CLINICAL DATA:  Shortness of breath EXAM: PORTABLE CHEST 1 VIEW COMPARISON:  None. FINDINGS: The heart size and mediastinal contours are within normal limits. Both lungs are clear. The visualized skeletal structures are unremarkable. IMPRESSION: No active disease. Electronically Signed   By: Deatra Robinson M.D.   On: 01/09/2018 22:55     LOS: 2 days   Jeoffrey Massed, MD  [facility]  If 7PM-7AM, please contact night-coverage  Please page via [email or web address]-click on MD name and type text message  01/12/2018, 10:20 AM

## 2018-01-13 ENCOUNTER — Encounter (HOSPITAL_COMMUNITY): Payer: Self-pay | Admitting: General Practice

## 2018-01-13 LAB — CBC
HEMATOCRIT: 30.5 % — AB (ref 36.0–46.0)
Hemoglobin: 10.1 g/dL — ABNORMAL LOW (ref 12.0–15.0)
MCH: 30.8 pg (ref 26.0–34.0)
MCHC: 33.1 g/dL (ref 30.0–36.0)
MCV: 93 fL (ref 80.0–100.0)
Platelets: 277 10*3/uL (ref 150–400)
RBC: 3.28 MIL/uL — ABNORMAL LOW (ref 3.87–5.11)
RDW: 13.1 % (ref 11.5–15.5)
WBC: 7.5 10*3/uL (ref 4.0–10.5)
nRBC: 0 % (ref 0.0–0.2)

## 2018-01-13 LAB — COMPREHENSIVE METABOLIC PANEL
ALK PHOS: 41 U/L (ref 38–126)
ALT: 27 U/L (ref 0–44)
AST: 32 U/L (ref 15–41)
Albumin: 2.5 g/dL — ABNORMAL LOW (ref 3.5–5.0)
Anion gap: 7 (ref 5–15)
BILIRUBIN TOTAL: 0.8 mg/dL (ref 0.3–1.2)
BUN: <5 mg/dL — ABNORMAL LOW (ref 6–20)
CALCIUM: 8.2 mg/dL — AB (ref 8.9–10.3)
CO2: 23 mmol/L (ref 22–32)
CREATININE: 0.64 mg/dL (ref 0.44–1.00)
Chloride: 109 mmol/L (ref 98–111)
Glucose, Bld: 97 mg/dL (ref 70–99)
Potassium: 3.5 mmol/L (ref 3.5–5.1)
Sodium: 139 mmol/L (ref 135–145)
Total Protein: 5.9 g/dL — ABNORMAL LOW (ref 6.5–8.1)

## 2018-01-13 LAB — PROCALCITONIN: PROCALCITONIN: 2.65 ng/mL

## 2018-01-13 LAB — CULTURE, RESPIRATORY W GRAM STAIN: Culture: NORMAL

## 2018-01-13 LAB — PHOSPHORUS: Phosphorus: 2.7 mg/dL (ref 2.5–4.6)

## 2018-01-13 LAB — MAGNESIUM: Magnesium: 1.7 mg/dL (ref 1.7–2.4)

## 2018-01-13 LAB — CULTURE, RESPIRATORY

## 2018-01-13 NOTE — Progress Notes (Signed)
PROGRESS NOTE        PATIENT DETAILS Name: Maria Owens Age: 22 y.o. Sex: female Date of Birth: June 30, 1995 Admit Date: 01/09/2018 Admitting Physician Lorretta Harp, MD WJX:[id number], No Pcp Per  Brief Narrative: Patient is a 22 y.o. female with history of childhood asthma-presented to the ED with 3-4-day history of with fever, shortness of breath, cough-found to have acute hypoxic respiratory failure and sepsis secondary to multifocal pneumonia.  See below for further details.  Subjective: Still looks weak but better than the past few days-claims she is feeling somewhat better-fever curve also seems to be somewhat abating.  Assessment/Plan: Acute hypoxic respiratory failure: Secondary to multifocal pneumonia (denies vaping)-slowly improving-oxygen requirements have come down-down to 2 L this morning.  Continue empiric antimicrobial therapy-slowly continue to titrate off oxygen.  She is comfortable at rest-and does not appear tachypneic.  Easily speaking in full sentences.  Sepsis secondary to multifocal pneumonia: Likely viral pneumonitis-respiratory virus panel positive for rhinovirus/parainfluenza virus.  Blood and sputum cultures negative so far.  Urine negative for Legionella/streptococcal antigen.  Slowly improving-fever curve seems to have gotten better-stop vancomycin-continue with Rocephin and Zithromax.  Procalcitonin levels are also downtrending.  Will discuss with ID over the phone later today.    Hypokalemia: Repleted.  DVT Prophylaxis: Prophylactic Lovenox  Code Status: Full code  Family Communication: Sister at bedside  Disposition Plan: Remain inpatient- will requires several more days of hospitalization before discharge as she is still acutely ill and hypoxic.  Antimicrobial agents: Anti-infectives (From admission, onward)   Start     Dose/Rate Route Frequency Ordered Stop   01/11/18 1200  cefTRIAXone (ROCEPHIN) 2 g in sodium chloride 0.9  % 100 mL IVPB     2 g 200 mL/hr over 30 Minutes Intravenous Every 24 hours 01/11/18 1122     01/11/18 0000  azithromycin (ZITHROMAX) 500 mg in sodium chloride 0.9 % 250 mL IVPB     500 mg 250 mL/hr over 60 Minutes Intravenous Every 24 hours 01/10/18 0953     01/10/18 1730  vancomycin (VANCOCIN) IVPB 750 mg/150 ml premix     750 mg 150 mL/hr over 60 Minutes Intravenous Every 8 hours 01/10/18 0910     01/10/18 1000  piperacillin-tazobactam (ZOSYN) IVPB 3.375 g  Status:  Discontinued     3.375 g 12.5 mL/hr over 240 Minutes Intravenous Every 8 hours 01/10/18 0953 01/11/18 1122   01/10/18 1000  oseltamivir (TAMIFLU) capsule 75 mg  Status:  Discontinued     75 mg Oral 2 times daily 01/10/18 0916 01/11/18 1122   01/10/18 0915  vancomycin (VANCOCIN) IVPB 1000 mg/200 mL premix     1,000 mg 200 mL/hr over 60 Minutes Intravenous  Once 01/10/18 0910 01/10/18 1217   01/10/18 0330  cefTRIAXone (ROCEPHIN) 1 g in sodium chloride 0.9 % 100 mL IVPB     1 g 200 mL/hr over 30 Minutes Intravenous  Once 01/10/18 0323 01/10/18 0651   01/10/18 0330  azithromycin (ZITHROMAX) 500 mg in sodium chloride 0.9 % 250 mL IVPB     500 mg 250 mL/hr over 60 Minutes Intravenous  Once 01/10/18 0323 01/10/18 0919      Procedures: None  CONSULTS:  pulmonary/intensive care  Time spent: 25 minutes-Greater than 50% of this time was spent in counseling, explanation of diagnosis, planning of further management, and coordination of care.  MEDICATIONS: Scheduled Meds: . AEROCHAMBER PLUS FLO-VU LARGE  1 each Other Once  . enoxaparin (LOVENOX) injection  40 mg Subcutaneous Q24H  . guaiFENesin  600 mg Oral BID   Continuous Infusions: . sodium chloride 1,000 mL (01/12/18 1633)  . azithromycin 500 mg (01/12/18 2348)  . cefTRIAXone (ROCEPHIN)  IV 2 g (01/13/18 1142)  . vancomycin 750 mg (01/13/18 1025)   PRN Meds:.acetaminophen, benzonatate, ibuprofen, levalbuterol, phenol   PHYSICAL EXAM: Vital signs: Vitals:    01/13/18 0600 01/13/18 0758 01/13/18 0800 01/13/18 1030  BP:   120/74   Pulse: (!) 101  95 (!) 104  Resp: (!) 26  18 12   Temp:  99.4 F (37.4 C)  99.1 F (37.3 C)  TempSrc:  Oral    SpO2: 99%  100% 99%  Weight:      Height:       Filed Weights   01/09/18 2112 01/10/18 0508 01/10/18 1250  Weight: 54 kg 56.9 kg 58.5 kg   Body mass index is 20.82 kg/m.   General appearance:Awake, alert, not in any distress.  Eyes:no scleral icterus. HEENT: Atraumatic and Normocephalic Neck: supple, no JVD. Resp:Good air entry bilaterally, fine bibasilar rales CVS: S1 S2 regular, no murmurs.  GI: Bowel sounds present, Non tender and not distended with no gaurding, rigidity or rebound. Extremities: B/L Lower Ext shows no edema, both legs are warm to touch Neurology:  Non focal Psychiatric: Normal judgment and insight. Normal mood. Musculoskeletal:No digital cyanosis Skin:No Rash, warm and dry Wounds:N/A  I have personally reviewed following labs and imaging studies  LABORATORY DATA: CBC: Recent Labs  Lab 01/09/18 2118 01/10/18 0956 01/12/18 0254 01/13/18 0551  WBC 6.8 3.8* 7.9 7.5  HGB 13.1 10.8* 9.9* 10.1*  HCT 41.1 33.2* 30.4* 30.5*  MCV 93.8 94.3 93.8 93.0  PLT 281 192 246 277    Basic Metabolic Panel: Recent Labs  Lab 01/09/18 2118 01/10/18 0956 01/11/18 0519 01/12/18 0254 01/12/18 1026 01/13/18 0551  NA 138  --  137 139  --  139  K 3.7  --  3.4* 3.2*  --  3.5  CL 102  --  110 111  --  109  CO2 24  --  21* 21*  --  23  GLUCOSE 119*  --  112* 104*  --  97  BUN 7  --  <5* <5*  --  <5*  CREATININE 0.74 0.77 0.67 0.57  --  0.64  CALCIUM 9.5  --  7.9* 8.0*  --  8.2*  MG  --  1.3*  --   --  1.8 1.7  PHOS  --  2.0*  --   --  1.4* 2.7    GFR: Estimated Creatinine Clearance: 104.5 mL/min (by C-G formula based on SCr of 0.64 mg/dL).  Liver Function Tests: Recent Labs  Lab 01/11/18 0519 01/13/18 0551  AST 19 32  ALT 13 27  ALKPHOS 45 41  BILITOT 0.8 0.8  PROT  5.3* 5.9*  ALBUMIN 2.5* 2.5*   No results for input(s): LIPASE, AMYLASE in the last 168 hours. No results for input(s): AMMONIA in the last 168 hours.  Coagulation Profile: No results for input(s): INR, PROTIME in the last 168 hours.  Cardiac Enzymes: No results for input(s): CKTOTAL, CKMB, CKMBINDEX, TROPONINI in the last 168 hours.  BNP (last 3 results) No results for input(s): PROBNP in the last 8760 hours.  HbA1C: No results for input(s): HGBA1C in the last 72 hours.  CBG: No results for  input(s): GLUCAP in the last 168 hours.  Lipid Profile: No results for input(s): CHOL, HDL, LDLCALC, TRIG, CHOLHDL, LDLDIRECT in the last 72 hours.  Thyroid Function Tests: No results for input(s): TSH, T4TOTAL, FREET4, T3FREE, THYROIDAB in the last 72 hours.  Anemia Panel: No results for input(s): VITAMINB12, FOLATE, FERRITIN, TIBC, IRON, RETICCTPCT in the last 72 hours.  Urine analysis:    Component Value Date/Time   COLORURINE STRAW (A) 01/11/2018 1325   APPEARANCEUR CLEAR 01/11/2018 1325   LABSPEC 1.005 01/11/2018 1325   PHURINE 7.0 01/11/2018 1325   GLUCOSEU NEGATIVE 01/11/2018 1325   HGBUR NEGATIVE 01/11/2018 1325   BILIRUBINUR NEGATIVE 01/11/2018 1325   KETONESUR NEGATIVE 01/11/2018 1325   PROTEINUR NEGATIVE 01/11/2018 1325   NITRITE NEGATIVE 01/11/2018 1325   LEUKOCYTESUR NEGATIVE 01/11/2018 1325    Sepsis Labs: Lactic Acid, Venous    Component Value Date/Time   LATICACIDVEN 1.3 01/10/2018 2241    MICROBIOLOGY: Recent Results (from the past 240 hour(s))  Culture, blood (routine x 2) Call MD if unable to obtain prior to antibiotics being given     Status: None (Preliminary result)   Collection Time: 01/10/18  5:45 AM  Result Value Ref Range Status   Specimen Description BLOOD RIGHT ANTECUBITAL  Final   Special Requests   Final    BOTTLES DRAWN AEROBIC AND ANAEROBIC Blood Culture adequate volume   Culture   Final    NO GROWTH 3 DAYS Performed at [facility], [address] [place], [place] [address]    Report Status PENDING  Incomplete  Culture, blood (routine x 2) Call MD if unable to obtain prior to antibiotics being given     Status: None (Preliminary result)   Collection Time: 01/10/18  5:46 AM  Result Value Ref Range Status   Specimen Description BLOOD BLOOD RIGHT HAND  Final   Special Requests   Final    BOTTLES DRAWN AEROBIC AND ANAEROBIC Blood Culture results may not be optimal due to an inadequate volume of blood received in culture bottles   Culture   Final    NO GROWTH 3 DAYS Performed at [facility], [address], [place], [place] [address]    Report Status PENDING  Incomplete  Culture, sputum-assessment     Status: None   Collection Time: 01/10/18 11:35 PM  Result Value Ref Range Status   Specimen Description EXPECTORATED SPUTUM  Final   Special Requests NONE  Final   Sputum evaluation   Final    THIS SPECIMEN IS ACCEPTABLE FOR SPUTUM CULTURE Performed at [facility], [address], [place], [place] [address]    Report Status 01/11/2018 FINAL  Final  Culture, respiratory     Status: None   Collection Time: 01/10/18 11:35 PM  Result Value Ref Range Status   Specimen Description EXPECTORATED SPUTUM  Final   Special Requests NONE Reflexed from [id number]  Final   Gram Stain   Final    ABUNDANT WBC PRESENT,BOTH PMN AND MONONUCLEAR RARE GRAM POSITIVE COCCI    Culture   Final    RARE Consistent with normal respiratory flora. Performed at [facility], [address] [place], [place] [address]    Report Status 01/13/2018 FINAL  Final  Respiratory Panel by PCR     Status: Abnormal   Collection Time: 01/11/18  2:26 AM  Result Value Ref Range Status   Adenovirus NOT DETECTED NOT DETECTED Final   Coronavirus 229E NOT DETECTED NOT  DETECTED Final   Coronavirus HKU1 NOT DETECTED NOT DETECTED Final   Coronavirus NL63 NOT DETECTED NOT DETECTED Final   Coronavirus OC43 NOT DETECTED NOT DETECTED Final     Metapneumovirus NOT DETECTED NOT DETECTED Final   Rhinovirus / Enterovirus DETECTED (A) NOT DETECTED Final   Influenza A NOT DETECTED NOT DETECTED Final   Influenza B NOT DETECTED NOT DETECTED Final   Parainfluenza Virus 1 DETECTED (A) NOT DETECTED Final   Parainfluenza Virus 2 NOT DETECTED NOT DETECTED Final   Parainfluenza Virus 3 NOT DETECTED NOT DETECTED Final   Parainfluenza Virus 4 NOT DETECTED NOT DETECTED Final   Respiratory Syncytial Virus NOT DETECTED NOT DETECTED Final   Bordetella pertussis NOT DETECTED NOT DETECTED Final   Chlamydophila pneumoniae NOT DETECTED NOT DETECTED Final   Mycoplasma pneumoniae NOT DETECTED NOT DETECTED Final    Comment: Performed at [facility], [address], [place], [place] [address]    RADIOLOGY STUDIES/RESULTS: Ct Angio Chest Pe W And/or Wo Contrast  Result Date: 01/10/2018 CLINICAL DATA:  PE suspected, high prob, normal CXR. Shortness of breath. Cough. EXAM: CT ANGIOGRAPHY CHEST WITH CONTRAST TECHNIQUE: Multidetector CT imaging of the chest was performed using the standard protocol during bolus administration of intravenous contrast. Multiplanar CT image reconstructions and MIPs were obtained to evaluate the vascular anatomy. CONTRAST:  ISOVUE-370 IOPAMIDOL (ISOVUE-370) INJECTION 76% COMPARISON:  Chest radiograph yesterday. FINDINGS: Cardiovascular: There are no filling defects within the pulmonary arteries to suggest pulmonary embolus. Thoracic aorta is normal in caliber. Heart is normal in size. No pericardial effusion. Mediastinum/Nodes: Shotty bilateral hilar adenopathy with nodes measuring 11 mm on the left and 12 mm on the right. Shotty mediastinal lymph nodes all subcentimeter. 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 Esophagus is nondistended. No visualized thyroid nodule. Lungs/Pleura: Multifocal consolidative and ground-glass opacities throughout both lungs, more prominent on the left and greatest in the left  lower lobe. There is moderate central bronchial thickening. No significant pleural fluid. Upper Abdomen: No acute abnormality. Musculoskeletal: There are no acute or suspicious osseous abnormalities. Review of the MIP images confirms the above findings. IMPRESSION: 1. No pulmonary embolus. 2. Multifocal consolidations throughout both lungs, suspicious for pneumonia. Findings can also be seen with vaping-related lung injury, recommend clinical correlation. 3. Moderate central bronchial wall thickening may be infectious or inflammatory such as asthma. Electronically Signed   By: Narda Rutherford M.D.   On: 01/10/2018 03:12   Dg Chest Port 1 View  Result Date: 01/10/2018 CLINICAL DATA:  Shortness of breath EXAM: PORTABLE CHEST 1 VIEW COMPARISON:  01/09/2018, CT chest 01/10/2018 FINDINGS: Heart size within normal limits. Interval worsening of multifocal consolidations and ground-glass opacity with dense consolidation at the left base. No pneumothorax. IMPRESSION: Significant interval radiographic worsening of multifocal bilateral left greater than right consolidations and ground-glass densities, differential of which includes multifocal pneumonia, pulmonary hemorrhage, and inhalation injury. Electronically Signed   By: Jasmine Pang M.D.   On: 01/10/2018 22:02   Dg Chest Port 1 View  Result Date: 01/09/2018 CLINICAL DATA:  Shortness of breath EXAM: PORTABLE CHEST 1 VIEW COMPARISON:  None. FINDINGS: The heart size and mediastinal contours are within normal limits. Both lungs are clear. The visualized skeletal structures are unremarkable. IMPRESSION: No active disease. Electronically Signed   By: Deatra Robinson M.D.   On: 01/09/2018 22:55     LOS: 3 days   Jeoffrey Massed, MD  [facility]  If 7PM-7AM, please contact night-coverage  Please page via  [email or web address] [email or web address]-click on MD name and type text message  01/13/2018, 11:54 AM

## 2018-01-14 ENCOUNTER — Inpatient Hospital Stay (HOSPITAL_COMMUNITY): Payer: BLUE CROSS/BLUE SHIELD

## 2018-01-14 LAB — CBC
HCT: 31.1 % — ABNORMAL LOW (ref 36.0–46.0)
HEMOGLOBIN: 10 g/dL — AB (ref 12.0–15.0)
MCH: 29.5 pg (ref 26.0–34.0)
MCHC: 32.2 g/dL (ref 30.0–36.0)
MCV: 91.7 fL (ref 80.0–100.0)
Platelets: 343 10*3/uL (ref 150–400)
RBC: 3.39 MIL/uL — AB (ref 3.87–5.11)
RDW: 12.9 % (ref 11.5–15.5)
WBC: 6.4 10*3/uL (ref 4.0–10.5)
nRBC: 0 % (ref 0.0–0.2)

## 2018-01-14 LAB — BASIC METABOLIC PANEL
Anion gap: 8 (ref 5–15)
CHLORIDE: 105 mmol/L (ref 98–111)
CO2: 25 mmol/L (ref 22–32)
CREATININE: 0.61 mg/dL (ref 0.44–1.00)
Calcium: 8.4 mg/dL — ABNORMAL LOW (ref 8.9–10.3)
GFR calc Af Amer: >60 mL/min (ref >60)
GLUCOSE: 104 mg/dL — AB (ref 70–99)
POTASSIUM: 3.4 mmol/L — AB (ref 3.5–5.1)
Sodium: 138 mmol/L (ref 135–145)

## 2018-01-14 MED ORDER — POTASSIUM CHLORIDE CRYS ER 20 MEQ PO TBCR
40.0000 meq | EXTENDED_RELEASE_TABLET | Freq: Once | ORAL | Status: AC
  Administered 2018-01-14: 40 meq via ORAL
  Filled 2018-01-14: qty 2

## 2018-01-14 NOTE — Care Management Note (Signed)
Case Management Note  Patient Details  Name: Maria Owens MRN: [id number] Date of Birth: 1995-07-20  Subjective/Objective:    Pneumonia               Action/Plan: Patient is a Consulting civil engineer at [employer]; from [place], [place]; states that she has a Family MD but does not know the name; has private insurance with BCBS with prescription drug coverage; pt states that she can go to [facility]; Independent of her ADL's; she is agreeable to go to [facility] for medical follow up; apt made with Sharion Dove NP for Jan 24, 2018 at 2 pm; the [facility] have a pharmacy on site also ; they ask that she bring her DC summary to the apt. CM will continue to follow for progression of care.  Expected Discharge Date:   possibly 01/19/2018               Expected Discharge Plan:  Home/Self Care  Discharge planning Services  CM Consult, Follow-up appt scheduled  Status of Service:  In process, will continue to follow  Reola Mosher [phone] 01/14/2018, 10:35 AM

## 2018-01-14 NOTE — Progress Notes (Signed)
PROGRESS NOTE        PATIENT DETAILS Name: Maria Owens Age: 22 y.o. Sex: female Date of Birth: 1996/02/27 Admit Date: 01/09/2018 Admitting Physician Lorretta Harp, MD JXB:[id number], No Pcp Per  Brief Narrative: Patient is a 22 y.o. female with history of childhood asthma-presented to the ED with 3-4-day history of with fever, shortness of breath, cough-found to have acute hypoxic respiratory failure and sepsis secondary to multifocal pneumonia.  See below for further details.  Subjective: Clinically much improved-much more awake-alert-off oxygen.  Afebrile for now 24 hours.  She reports significant clinical improvement-able to ambulate in the room without much shortness of breath.  Assessment/Plan: Acute hypoxic respiratory failure: Secondary to multifocal pneumonia (denies vaping)-significantly improved with supportive care, IV antimicrobial therapy-has been titrated off oxygen and is on room air this morning.    Sepsis secondary to multifocal pneumonia: Likely viral pneumonitis-respiratory virus panel positive for rhinovirus/parainfluenza virus.  Blood and sputum cultures negative so far.  Urine negative for Legionella/streptococcal antigen.  Procalcitonin levels are downtrending as well.  Significantly improved with empiric IV antibiotics-we will stop Zithromax today, continue Rocephin-if clinical improvement continues-suspect all antimicrobial therapy can be discontinued on 10/23.  Chest x-ray appears unchanged-but suspect radiological improvement will lag behind clinical improvement in the setting.  Will monitor for 1 additional day-if clinical improvement continues-she will be discharged home on 10/23 with plans to repeat two-view x-ray in the next 4 to 6 weeks.  Hypokalemia: Replete and recheck.  DVT Prophylaxis: Prophylactic Lovenox  Code Status: Full code  Family Communication: Sister at bedside  Disposition Plan: Remain inpatient-hopefully home on  10/23.  Antimicrobial agents: Anti-infectives (From admission, onward)   Start     Dose/Rate Route Frequency Ordered Stop   01/11/18 1200  cefTRIAXone (ROCEPHIN) 2 g in sodium chloride 0.9 % 100 mL IVPB     2 g 200 mL/hr over 30 Minutes Intravenous Every 24 hours 01/11/18 1122     01/11/18 0000  azithromycin (ZITHROMAX) 500 mg in sodium chloride 0.9 % 250 mL IVPB  Status:  Discontinued     500 mg 250 mL/hr over 60 Minutes Intravenous Every 24 hours 01/10/18 0953 01/14/18 1047   01/10/18 1730  vancomycin (VANCOCIN) IVPB 750 mg/150 ml premix  Status:  Discontinued     750 mg 150 mL/hr over 60 Minutes Intravenous Every 8 hours 01/10/18 0910 01/13/18 1156   01/10/18 1000  piperacillin-tazobactam (ZOSYN) IVPB 3.375 g  Status:  Discontinued     3.375 g 12.5 mL/hr over 240 Minutes Intravenous Every 8 hours 01/10/18 0953 01/11/18 1122   01/10/18 1000  oseltamivir (TAMIFLU) capsule 75 mg  Status:  Discontinued     75 mg Oral 2 times daily 01/10/18 0916 01/11/18 1122   01/10/18 0915  vancomycin (VANCOCIN) IVPB 1000 mg/200 mL premix     1,000 mg 200 mL/hr over 60 Minutes Intravenous  Once 01/10/18 0910 01/10/18 1217   01/10/18 0330  cefTRIAXone (ROCEPHIN) 1 g in sodium chloride 0.9 % 100 mL IVPB     1 g 200 mL/hr over 30 Minutes Intravenous  Once 01/10/18 0323 01/10/18 0651   01/10/18 0330  azithromycin (ZITHROMAX) 500 mg in sodium chloride 0.9 % 250 mL IVPB     500 mg 250 mL/hr over 60 Minutes Intravenous  Once 01/10/18 0323 01/10/18 0919      Procedures: None  CONSULTS:  pulmonary/intensive care  Time spent: 25 minutes-Greater than 50% of this time was spent in counseling, explanation of diagnosis, planning of further management, and coordination of care.  MEDICATIONS: Scheduled Meds: . AEROCHAMBER PLUS FLO-VU LARGE  1 each Other Once  . enoxaparin (LOVENOX) injection  40 mg Subcutaneous Q24H  . guaiFENesin  600 mg Oral BID   Continuous Infusions: . cefTRIAXone (ROCEPHIN)  IV  Stopped (01/13/18 1212)   PRN Meds:.acetaminophen, benzonatate, ibuprofen, levalbuterol, phenol   PHYSICAL EXAM: Vital signs: Vitals:   01/14/18 0400 01/14/18 0500 01/14/18 0600 01/14/18 0815  BP:    117/81  Pulse: 76 86 83   Resp: (!) 24 18 20    Temp:    98.2 F (36.8 C)  TempSrc:    Oral  SpO2: 97% 96% 97%   Weight:      Height:       Filed Weights   01/09/18 2112 01/10/18 0508 01/10/18 1250  Weight: 54 kg 56.9 kg 58.5 kg   Body mass index is 20.82 kg/m.   General appearance:Awake, alert, not in any distress.  Eyes:no scleral icterus. HEENT: Atraumatic and Normocephalic Neck: supple, no JVD. Resp:Good air entry bilaterally,no rales or rhonchi CVS: S1 S2 regular, no murmurs.  GI: Bowel sounds present, Non tender and not distended with no gaurding, rigidity or rebound. Extremities: B/L Lower Ext shows no edema, both legs are warm to touch Neurology:  Non focal Psychiatric: Normal judgment and insight. Normal mood. Musculoskeletal:No digital cyanosis Skin:No Rash, warm and dry Wounds:N/A  I have personally reviewed following labs and imaging studies  LABORATORY DATA: CBC: Recent Labs  Lab 01/09/18 2118 01/10/18 0956 01/12/18 0254 01/13/18 0551 01/14/18 0310  WBC 6.8 3.8* 7.9 7.5 6.4  HGB 13.1 10.8* 9.9* 10.1* 10.0*  HCT 41.1 33.2* 30.4* 30.5* 31.1*  MCV 93.8 94.3 93.8 93.0 91.7  PLT 281 192 246 277 343    Basic Metabolic Panel: Recent Labs  Lab 01/09/18 2118 01/10/18 0956 01/11/18 0519 01/12/18 0254 01/12/18 1026 01/13/18 0551 01/14/18 0310  NA 138  --  137 139  --  139 138  K 3.7  --  3.4* 3.2*  --  3.5 3.4*  CL 102  --  110 111  --  109 105  CO2 24  --  21* 21*  --  23 25  GLUCOSE 119*  --  112* 104*  --  97 104*  BUN 7  --  <5* <5*  --  <5* <5*  CREATININE 0.74 0.77 0.67 0.57  --  0.64 0.61  CALCIUM 9.5  --  7.9* 8.0*  --  8.2* 8.4*  MG  --  1.3*  --   --  1.8 1.7  --   PHOS  --  2.0*  --   --  1.4* 2.7  --     GFR: Estimated  Creatinine Clearance: 104.5 mL/min (by C-G formula based on SCr of 0.61 mg/dL).  Liver Function Tests: Recent Labs  Lab 01/11/18 0519 01/13/18 0551  AST 19 32  ALT 13 27  ALKPHOS 45 41  BILITOT 0.8 0.8  PROT 5.3* 5.9*  ALBUMIN 2.5* 2.5*   No results for input(s): LIPASE, AMYLASE in the last 168 hours. No results for input(s): AMMONIA in the last 168 hours.  Coagulation Profile: No results for input(s): INR, PROTIME in the last 168 hours.  Cardiac Enzymes: No results for input(s): CKTOTAL, CKMB, CKMBINDEX, TROPONINI in the last 168 hours.  BNP (last 3 results) No results  for input(s): PROBNP in the last 8760 hours.  HbA1C: No results for input(s): HGBA1C in the last 72 hours.  CBG: No results for input(s): GLUCAP in the last 168 hours.  Lipid Profile: No results for input(s): CHOL, HDL, LDLCALC, TRIG, CHOLHDL, LDLDIRECT in the last 72 hours.  Thyroid Function Tests: No results for input(s): TSH, T4TOTAL, FREET4, T3FREE, THYROIDAB in the last 72 hours.  Anemia Panel: No results for input(s): VITAMINB12, FOLATE, FERRITIN, TIBC, IRON, RETICCTPCT in the last 72 hours.  Urine analysis:    Component Value Date/Time   COLORURINE STRAW (A) 01/11/2018 1325   APPEARANCEUR CLEAR 01/11/2018 1325   LABSPEC 1.005 01/11/2018 1325   PHURINE 7.0 01/11/2018 1325   GLUCOSEU NEGATIVE 01/11/2018 1325   HGBUR NEGATIVE 01/11/2018 1325   BILIRUBINUR NEGATIVE 01/11/2018 1325   KETONESUR NEGATIVE 01/11/2018 1325   PROTEINUR NEGATIVE 01/11/2018 1325   NITRITE NEGATIVE 01/11/2018 1325   LEUKOCYTESUR NEGATIVE 01/11/2018 1325    Sepsis Labs: Lactic Acid, Venous    Component Value Date/Time   LATICACIDVEN 1.3 01/10/2018 2241    MICROBIOLOGY: Recent Results (from the past 240 hour(s))  Culture, blood (routine x 2) Call MD if unable to obtain prior to antibiotics being given     Status: None (Preliminary result)   Collection Time: 01/10/18  5:45 AM  Result Value Ref Range Status    Specimen Description BLOOD RIGHT ANTECUBITAL  Final   Special Requests   Final    BOTTLES DRAWN AEROBIC AND ANAEROBIC Blood Culture adequate volume   Culture   Final    NO GROWTH 4 DAYS Performed at [facility], [address], [place], [place] [address]    Report Status PENDING  Incomplete  Culture, blood (routine x 2) Call MD if unable to obtain prior to antibiotics being given     Status: None (Preliminary result)   Collection Time: 01/10/18  5:46 AM  Result Value Ref Range Status   Specimen Description BLOOD BLOOD RIGHT HAND  Final   Special Requests   Final    BOTTLES DRAWN AEROBIC AND ANAEROBIC Blood Culture results may not be optimal due to an inadequate volume of blood received in culture bottles   Culture   Final    NO GROWTH 4 DAYS Performed at [facility], [address], [place], [place] [address]    Report Status PENDING  Incomplete  Culture, sputum-assessment     Status: None   Collection Time: 01/10/18 11:35 PM  Result Value Ref Range Status   Specimen Description EXPECTORATED SPUTUM  Final   Special Requests NONE  Final   Sputum evaluation   Final    THIS SPECIMEN IS ACCEPTABLE FOR SPUTUM CULTURE Performed at [facility], [address] [place], [place] [address]    Report Status 01/11/2018 FINAL  Final  Culture, respiratory     Status: None   Collection Time: 01/10/18 11:35 PM  Result Value Ref Range Status   Specimen Description EXPECTORATED SPUTUM  Final   Special Requests NONE Reflexed from [id number]  Final   Gram Stain   Final    ABUNDANT WBC PRESENT,BOTH PMN AND MONONUCLEAR RARE GRAM POSITIVE COCCI    Culture   Final    RARE Consistent with normal respiratory flora. Performed at [facility], [address] [place], [place] [address]    Report Status 01/13/2018 FINAL  Final  Respiratory Panel by PCR     Status: Abnormal   Collection Time:  01/11/18  2:26 AM  Result Value Ref Range Status   Adenovirus NOT DETECTED NOT DETECTED  Final   Coronavirus 229E NOT DETECTED NOT DETECTED Final   Coronavirus HKU1 NOT DETECTED NOT DETECTED Final   Coronavirus NL63 NOT DETECTED NOT DETECTED Final   Coronavirus OC43 NOT DETECTED NOT DETECTED Final   Metapneumovirus NOT DETECTED NOT DETECTED Final   Rhinovirus / Enterovirus DETECTED (A) NOT DETECTED Final   Influenza A NOT DETECTED NOT DETECTED Final   Influenza B NOT DETECTED NOT DETECTED Final   Parainfluenza Virus 1 DETECTED (A) NOT DETECTED Final   Parainfluenza Virus 2 NOT DETECTED NOT DETECTED Final   Parainfluenza Virus 3 NOT DETECTED NOT DETECTED Final   Parainfluenza Virus 4 NOT DETECTED NOT DETECTED Final   Respiratory Syncytial Virus NOT DETECTED NOT DETECTED Final   Bordetella pertussis NOT DETECTED NOT DETECTED Final   Chlamydophila pneumoniae NOT DETECTED NOT DETECTED Final   Mycoplasma pneumoniae NOT DETECTED NOT DETECTED Final    Comment: Performed at [facility], [address] [place], [place] [address]    RADIOLOGY STUDIES/RESULTS: Ct Angio Chest Pe W And/or Wo Contrast  Result Date: 01/10/2018 CLINICAL DATA:  PE suspected, high prob, normal CXR. Shortness of breath. Cough. EXAM: CT ANGIOGRAPHY CHEST WITH CONTRAST TECHNIQUE: Multidetector CT imaging of the chest was performed using the standard protocol during bolus administration of intravenous contrast. Multiplanar CT image reconstructions and MIPs were obtained to evaluate the vascular anatomy. CONTRAST:  ISOVUE-370 IOPAMIDOL (ISOVUE-370) INJECTION 76% COMPARISON:  Chest radiograph yesterday. FINDINGS: Cardiovascular: There are no filling defects within the pulmonary arteries to suggest pulmonary embolus. Thoracic aorta is normal in caliber. Heart is normal in size. No pericardial effusion. Mediastinum/Nodes: Shotty bilateral hilar adenopathy with nodes measuring 11 mm on the left and 12 mm on the right. Shotty mediastinal lymph nodes all subcentimeter. Probable residual thymus in the anterior  mediastinum. Esophagus is nondistended. No visualized thyroid nodule. Lungs/Pleura: Multifocal consolidative and ground-glass opacities throughout both lungs, more prominent on the left and greatest in the left lower lobe. There is moderate central bronchial thickening. No significant pleural fluid. Upper Abdomen: No acute abnormality. Musculoskeletal: There are no acute or suspicious osseous abnormalities. Review of the MIP images confirms the above findings. IMPRESSION: 1. No pulmonary embolus. 2. Multifocal consolidations throughout both lungs, suspicious for pneumonia. Findings can also be seen with vaping-related lung injury, recommend clinical correlation. 3. Moderate central bronchial wall thickening may be infectious or inflammatory such as asthma. Electronically Signed   By: Narda Rutherford M.D.   On: 01/10/2018 03:12   Dg Chest Port 1 View  Result Date: 01/14/2018 CLINICAL DATA:  22 year old female with shortness of breath and cough. Multilobar bilateral infection suspected on the basis of 01/10/2018 chest CTA. Patient denied vaping and respiratory virus panel is positive for rhinovirus/parainfluenza virus. EXAM: PORTABLE CHEST 1 VIEW COMPARISON:  01/10/2018 and earlier. FINDINGS: Portable AP upright view at 0633 hours. No substantial improvement in bilateral vague and confluent pulmonary opacity with a lower lung predominance. Bilateral lower lobe atelectasis likely has occurred since the prior CTA. No pneumothorax. No definite pleural effusion. Mediastinal contours remain normal. Visualized tracheal air column is within normal limits. Negative visible bowel gas pattern. No osseous abnormality identified. IMPRESSION: 1. No substantial improvement in bilateral airspace disease since 01/10/2018. No definite pleural effusion. 2. Bilateral lower lobe atelectasis has developed since the CTA. Electronically Signed   By: Odessa Fleming M.D.   On: 01/14/2018 09:41  Dg Chest Port 1 View  Result Date:  01/10/2018 CLINICAL DATA:  Shortness of breath EXAM: PORTABLE CHEST 1 VIEW COMPARISON:  01/09/2018, CT chest 01/10/2018 FINDINGS: Heart size within normal limits. Interval worsening of multifocal consolidations and ground-glass opacity with dense consolidation at the left base. No pneumothorax. IMPRESSION: Significant interval radiographic worsening of multifocal bilateral left greater than right consolidations and ground-glass densities, differential of which includes multifocal pneumonia, pulmonary hemorrhage, and inhalation injury. Electronically Signed   By: Jasmine Pang M.D.   On: 01/10/2018 22:02   Dg Chest Port 1 View  Result Date: 01/09/2018 CLINICAL DATA:  Shortness of breath EXAM: PORTABLE CHEST 1 VIEW COMPARISON:  None. FINDINGS: The heart size and mediastinal contours are within normal limits. Both lungs are clear. The visualized skeletal structures are unremarkable. IMPRESSION: No active disease. Electronically Signed   By: Deatra Robinson M.D.   On: 01/09/2018 22:55     LOS: 4 days   Jeoffrey Massed, MD  [facility]  If 7PM-7AM, please contact night-coverage  Please page via [email or web address]-click on MD name and type text message  01/14/2018, 10:47 AM

## 2018-01-15 LAB — CULTURE, BLOOD (ROUTINE X 2)
Culture: NO GROWTH
Culture: NO GROWTH
Special Requests: ADEQUATE

## 2018-01-15 MED ORDER — CEFDINIR 300 MG PO CAPS: 300.0000 mg | ORAL_CAPSULE | Freq: Two times a day (BID) | ORAL | 0 refills | Status: AC

## 2018-01-15 MED ORDER — BENZONATATE 200 MG PO CAPS: 200.0000 mg | ORAL_CAPSULE | Freq: Two times a day (BID) | ORAL | 0 refills | Status: AC | PRN

## 2018-01-15 MED ORDER — ALBUTEROL SULFATE HFA 108 (90 BASE) MCG/ACT IN AERS: 2.0000 | INHALATION_SPRAY | Freq: Four times a day (QID) | RESPIRATORY_TRACT | 0 refills | Status: AC | PRN

## 2018-01-15 MED ORDER — GUAIFENESIN ER 600 MG PO TB12: 600.0000 mg | ORAL_TABLET | Freq: Two times a day (BID) | ORAL | 0 refills | Status: AC

## 2018-01-15 MED FILL — MUCUS RELIEF 600 MG TB12: 600 | 5 days supply | Qty: 10 | Fill #0

## 2018-01-15 MED FILL — BENZONATATE 100 MG CAP: 100 | 10 days supply | Qty: 40 | Fill #0

## 2018-01-15 MED FILL — VENTOLIN HFA 90 MCG INHALER: 108 (90 BAS | 25 days supply | Qty: 18 | Fill #0

## 2018-01-15 MED FILL — CEFDINIR 300 MG CAPSULE: 300 | 2 days supply | Qty: 4 | Fill #0

## 2018-01-15 NOTE — Discharge Summary (Signed)
PATIENT DETAILS Name: Maria Owens Age: 22 y.o. Sex: female Date of Birth: July 05, 1995 MRN: [id number]. Admitting Physician: Lorretta Harp, MD NWG:[id number], No Pcp Per  Admit Date: 01/09/2018 Discharge date: 01/15/2018  Recommendations for Outpatient Follow-up:  1. Follow up with PCP in 1-2 weeks 2. Please obtain BMP/CBC in one week 3. Please repeat 2 view chest x-ray document resolution of infiltrates in 3 to 4 weeks.  Admitted From:  Home  Disposition: Home   Home Health: No  Equipment/Devices: None  Discharge Condition: Stable  CODE STATUS: FULL CODE  Diet recommendation:  Regular  Brief Summary: See H&P, Labs, Consult and Test reports for all details in brief,Patient is a 22 y.o. female with history of childhood asthma-presented to the ED with 3-4-day history of with fever, shortness of breath, cough-found to have acute hypoxic respiratory failure and sepsis secondary to multifocal pneumonia.  See below for further details.  Brief Hospital Course: Acute hypoxic respiratory failure: Secondary to multifocal pneumonia (denies vaping)-significantly improved with supportive care, IV antimicrobial therapy-has been titrated off oxygen and is on room air for the past 2 days.  She has ambulated in the hallway without difficulty and is significantly improved.  Sepsis secondary to multifocal pneumonia: Likely viral pneumonitis-respiratory virus panel positive for rhinovirus/parainfluenza virus.  Blood and sputum cultures negative so far.  Urine negative for Legionella/streptococcal antigen.  Procalcitonin levels are downtrending as well.  Significantly improved with empiric IV antibiotics-Zithromax was stopped on 10/22, she was maintained on Rocephin-we will transition to oral antimicrobial therapy today and discharge home.  She will require two-view repeat chest x-ray in the next 3 to 4 weeks to document resolution of pneumonia.  On day of discharge-afebrile for more than 48  hours, on room air and significantly clinically improved.    Hypokalemia: Repleted  Procedures/Studies: 10/20>>Echo - Left ventricle: The cavity size was normal. Systolic function was   normal. The estimated ejection fraction was in the range of 55%   to 60%. Wall motion was normal; there were no regional wall   motion abnormalities. Left ventricular diastolic function   parameters were normal. - Aortic valve: There was no regurgitation. - Aortic root: The aortic root was normal in size. - Mitral valve: There was no regurgitation. - Right ventricle: Systolic function was normal. - Right atrium: The atrium was normal in size. - Tricuspid valve: There was mild regurgitation. - Pulmonary arteries: Systolic pressure was within the normal   range. - Inferior vena cava: The vessel was normal in size. - Pericardium, extracardiac: There was no pericardial effusion.  Discharge Diagnoses:  Principal Problem:   Lobar pneumonia (HCC) Active Problems:   Sepsis (HCC)   Multifocal pneumonia   Discharge Instructions:  Activity:  As tolerated  Discharge Instructions    Diet general   Complete by:  As directed    Discharge instructions   Complete by:  As directed    Follow with Primary MD in 1-2 weeks   You had Pneumonia: Please ask your primary care practitioner to get a 2 view Chest X ray done in 3-4 weeks after hospital discharge   Please get a complete blood count and chemistry panel checked by your Primary MD at your next visit, and again as instructed by your Primary MD.  Get Medicines reviewed and adjusted: Please take all your medications with you for your next visit with your Primary MD  Laboratory/radiological data: Please request your Primary MD to go over all hospital tests and procedure/radiological results at  the follow up, please ask your Primary MD to get all Hospital records sent to his/her office.  In some cases, they will be blood work, cultures and biopsy  results pending at the time of your discharge. Please request that your primary care M.D. follows up on these results.  Also Note the following: If you experience worsening of your admission symptoms, develop shortness of breath, life threatening emergency, suicidal or homicidal thoughts you must seek medical attention immediately by calling 911 or calling your MD immediately  if symptoms less severe.  You must read complete instructions/literature along with all the possible adverse reactions/side effects for all the Medicines you take and that have been prescribed to you. Take any new Medicines after you have completely understood and accpet all the possible adverse reactions/side effects.   Do not drive when taking Pain medications or sleeping medications (Benzodaizepines)  Do not take more than prescribed Pain, Sleep and Anxiety Medications. It is not advisable to combine anxiety,sleep and pain medications without talking with your primary care practitioner  Special Instructions: If you have smoked or chewed Tobacco  in the last 2 yrs please stop smoking, stop any regular Alcohol  and or any Recreational drug use.  Wear Seat belts while driving.  Please note: You were cared for by a hospitalist during your hospital stay. Once you are discharged, your primary care physician will handle any further medical issues. Please note that NO REFILLS for any discharge medications will be authorized once you are discharged, as it is imperative that you return to your primary care physician (or establish a relationship with a primary care physician if you do not have one) for your post hospital discharge needs so that they can reassess your need for medications and monitor your lab values.   Increase activity slowly   Complete by:  As directed      Allergies as of 01/15/2018   No Known Allergies     Medication List    TAKE these medications   albuterol 108 (90 Base) MCG/ACT inhaler Commonly known  as:  PROVENTIL HFA;VENTOLIN HFA Inhale 2 puffs into the lungs every 6 (six) hours as needed for wheezing or shortness of breath.   benzonatate 200 MG capsule Commonly known as:  TESSALON Take 1 capsule (200 mg total) by mouth 2 (two) times daily as needed for cough.   cefdinir 300 MG capsule Commonly known as:  OMNICEF Take 1 capsule (300 mg total) by mouth 2 (two) times daily.   guaiFENesin 600 MG 12 hr tablet Commonly known as:  MUCINEX Take 1 tablet (600 mg total) by mouth 2 (two) times daily.   VICKS DAYQUIL HBP COLD & FLU 325-10 MG Caps Generic drug:  Acetaminophen-DM Take 1 capsule by mouth 2 (two) times daily as needed (for cough).      Follow-up Information    [place], [place] [facility] Follow up on 01/24/2018.   Why:  at 2 pm; Please try to keep your apt or call to reschedule Contact information: [address] [place] [address] [phone]          No Known Allergies  Consultations:   pulmonary/intensive care  Other Procedures/Studies: Ct Angio Chest Pe W And/or Wo Contrast  Result Date: 01/10/2018 CLINICAL DATA:  PE suspected, high prob, normal CXR. Shortness of breath. Cough. EXAM: CT ANGIOGRAPHY CHEST WITH CONTRAST TECHNIQUE: Multidetector CT imaging of the chest was performed using the standard protocol during bolus administration of intravenous contrast. Multiplanar CT image  reconstructions and MIPs were obtained to evaluate the vascular anatomy. CONTRAST:  ISOVUE-370 IOPAMIDOL (ISOVUE-370) INJECTION 76% COMPARISON:  Chest radiograph yesterday. FINDINGS: Cardiovascular: There are no filling defects within the pulmonary arteries to suggest pulmonary embolus. Thoracic aorta is normal in caliber. Heart is normal in size. No pericardial effusion. Mediastinum/Nodes: Shotty bilateral hilar adenopathy with nodes measuring 11 mm on the left and 12 mm on the right. Shotty mediastinal lymph nodes all subcentimeter. Probable residual thymus in the  anterior mediastinum. Esophagus is nondistended. No visualized thyroid nodule. Lungs/Pleura: Multifocal consolidative and ground-glass opacities throughout both lungs, more prominent on the left and greatest in the left lower lobe. There is moderate central bronchial thickening. No significant pleural fluid. Upper Abdomen: No acute abnormality. Musculoskeletal: There are no acute or suspicious osseous abnormalities. Review of the MIP images confirms the above findings. IMPRESSION: 1. No pulmonary embolus. 2. Multifocal consolidations throughout both lungs, suspicious for pneumonia. Findings can also be seen with vaping-related lung injury, recommend clinical correlation. 3. Moderate central bronchial wall thickening may be infectious or inflammatory such as asthma. Electronically Signed   By: Narda Rutherford M.D.   On: 01/10/2018 03:12   Dg Chest Port 1 View  Result Date: 01/14/2018 CLINICAL DATA:  22 year old female with shortness of breath and cough. Multilobar bilateral infection suspected on the basis of 01/10/2018 chest CTA. Patient denied vaping and respiratory virus panel is positive for rhinovirus/parainfluenza virus. EXAM: PORTABLE CHEST 1 VIEW COMPARISON:  01/10/2018 and earlier. FINDINGS: Portable AP upright view at 0633 hours. No substantial improvement in bilateral vague and confluent pulmonary opacity with a lower lung predominance. Bilateral lower lobe atelectasis likely has occurred since the prior CTA. No pneumothorax. No definite pleural effusion. Mediastinal contours remain normal. Visualized tracheal air column is within normal limits. Negative visible bowel gas pattern. No osseous abnormality identified. IMPRESSION: 1. No substantial improvement in bilateral airspace disease since 01/10/2018. No definite pleural effusion. 2. Bilateral lower lobe atelectasis has developed since the CTA. Electronically Signed   By: Odessa Fleming M.D.   On: 01/14/2018 09:41   Dg Chest Port 1 View  Result Date:  01/10/2018 CLINICAL DATA:  Shortness of breath EXAM: PORTABLE CHEST 1 VIEW COMPARISON:  01/09/2018, CT chest 01/10/2018 FINDINGS: Heart size within normal limits. Interval worsening of multifocal consolidations and ground-glass opacity with dense consolidation at the left base. No pneumothorax. IMPRESSION: Significant interval radiographic worsening of multifocal bilateral left greater than right consolidations and ground-glass densities, differential of which includes multifocal pneumonia, pulmonary hemorrhage, and inhalation injury. Electronically Signed   By: Jasmine Pang M.D.   On: 01/10/2018 22:02   Dg Chest Port 1 View  Result Date: 01/09/2018 CLINICAL DATA:  Shortness of breath EXAM: PORTABLE CHEST 1 VIEW COMPARISON:  None. FINDINGS: The heart size and mediastinal contours are within normal limits. Both lungs are clear. The visualized skeletal structures are unremarkable. IMPRESSION: No active disease. Electronically Signed   By: Deatra Robinson M.D.   On: 01/09/2018 22:55      TODAY-DAY OF DISCHARGE:  Subjective:   Lynzee Alleva today has no headache,no chest abdominal pain,no new weakness tingling or numbness, feels much better wants to go home today.   Objective:   Blood pressure 108/60, pulse 61, temperature 97.9 F (36.6 C), temperature source Oral, resp. rate 20, height 5\' 6"  (1.676 m), weight 58.5 kg, SpO2 97 %.  Intake/Output Summary (Last 24 hours) at 01/15/2018 0839 Last data filed at 01/14/2018 1610 Gross per 24 hour  Intake 300  ml  Output -  Net 300 ml   Filed Weights   01/09/18 2112 01/10/18 0508 01/10/18 1250  Weight: 54 kg 56.9 kg 58.5 kg    Exam: Awake Alert, Oriented *3, No new F.N deficits, Normal affect [place].AT,PERRAL Supple Neck,No JVD, No cervical lymphadenopathy appriciated.  Symmetrical Chest wall movement, Good air movement bilaterally, CTAB RRR,No Gallops,Rubs or new Murmurs, No Parasternal Heave +ve B.Sounds, Abd Soft, Non tender, No organomegaly  appriciated, No rebound -guarding or rigidity. No Cyanosis, Clubbing or edema, No new Rash or bruise   PERTINENT RADIOLOGIC STUDIES: Ct Angio Chest Pe W And/or Wo Contrast  Result Date: 01/10/2018 CLINICAL DATA:  PE suspected, high prob, normal CXR. Shortness of breath. Cough. EXAM: CT ANGIOGRAPHY CHEST WITH CONTRAST TECHNIQUE: Multidetector CT imaging of the chest was performed using the standard protocol during bolus administration of intravenous contrast. Multiplanar CT image reconstructions and MIPs were obtained to evaluate the vascular anatomy. CONTRAST:  ISOVUE-370 IOPAMIDOL (ISOVUE-370) INJECTION 76% COMPARISON:  Chest radiograph yesterday. FINDINGS: Cardiovascular: There are no filling defects within the pulmonary arteries to suggest pulmonary embolus. Thoracic aorta is normal in caliber. Heart is normal in size. No pericardial effusion. Mediastinum/Nodes: Shotty bilateral hilar adenopathy with nodes measuring 11 mm on the left and 12 mm on the right. Shotty mediastinal lymph nodes all subcentimeter. 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 Esophagus is nondistended. No visualized thyroid nodule. Lungs/Pleura: Multifocal consolidative and ground-glass opacities throughout both lungs, more prominent on the left and greatest in the left lower lobe. There is moderate central bronchial thickening. No significant pleural fluid. Upper Abdomen: No acute abnormality. Musculoskeletal: There are no acute or suspicious osseous abnormalities. Review of the MIP images confirms the above findings. IMPRESSION: 1. No pulmonary embolus. 2. Multifocal consolidations throughout both lungs, suspicious for pneumonia. Findings can also be seen with vaping-related lung injury, recommend clinical correlation. 3. Moderate central bronchial wall thickening may be infectious or inflammatory such as asthma. Electronically Signed   By: Narda Rutherford M.D.   On: 01/10/2018 03:12   Dg Chest Port 1  View  Result Date: 01/14/2018 CLINICAL DATA:  22 year old female with shortness of breath and cough. Multilobar bilateral infection suspected on the basis of 01/10/2018 chest CTA. Patient denied vaping and respiratory virus panel is positive for rhinovirus/parainfluenza virus. EXAM: PORTABLE CHEST 1 VIEW COMPARISON:  01/10/2018 and earlier. FINDINGS: Portable AP upright view at 0633 hours. No substantial improvement in bilateral vague and confluent pulmonary opacity with a lower lung predominance. Bilateral lower lobe atelectasis likely has occurred since the prior CTA. No pneumothorax. No definite pleural effusion. Mediastinal contours remain normal. Visualized tracheal air column is within normal limits. Negative visible bowel gas pattern. No osseous abnormality identified. IMPRESSION: 1. No substantial improvement in bilateral airspace disease since 01/10/2018. No definite pleural effusion. 2. Bilateral lower lobe atelectasis has developed since the CTA. Electronically Signed   By: Odessa Fleming M.D.   On: 01/14/2018 09:41   Dg Chest Port 1 View  Result Date: 01/10/2018 CLINICAL DATA:  Shortness of breath EXAM: PORTABLE CHEST 1 VIEW COMPARISON:  01/09/2018, CT chest 01/10/2018 FINDINGS: Heart size within normal limits. Interval worsening of multifocal consolidations and ground-glass opacity with dense consolidation at the left base. No pneumothorax. IMPRESSION: Significant interval radiographic worsening of multifocal bilateral left greater than right consolidations and ground-glass densities, differential of which includes multifocal pneumonia, pulmonary hemorrhage, and inhalation injury. Electronically Signed   By: Jasmine Pang M.D.   On: 01/10/2018 22:02  Dg Chest Port 1 View  Result Date: 01/09/2018 CLINICAL DATA:  Shortness of breath EXAM: PORTABLE CHEST 1 VIEW COMPARISON:  None. FINDINGS: The heart size and mediastinal contours are within normal limits. Both lungs are clear. The visualized skeletal  structures are unremarkable. IMPRESSION: No active disease. Electronically Signed   By: Deatra Robinson M.D.   On: 01/09/2018 22:55     PERTINENT LAB RESULTS: CBC: Recent Labs    01/13/18 0551 01/14/18 0310  WBC 7.5 6.4  HGB 10.1* 10.0*  HCT 30.5* 31.1*  PLT 277 343   CMET CMP     Component Value Date/Time   NA 138 01/14/2018 0310   K 3.4 (L) 01/14/2018 0310   CL 105 01/14/2018 0310   CO2 25 01/14/2018 0310   GLUCOSE 104 (H) 01/14/2018 0310   BUN <5 (L) 01/14/2018 0310   CREATININE 0.61 01/14/2018 0310   CALCIUM 8.4 (L) 01/14/2018 0310   PROT 5.9 (L) 01/13/2018 0551   ALBUMIN 2.5 (L) 01/13/2018 0551   AST 32 01/13/2018 0551   ALT 27 01/13/2018 0551   ALKPHOS 41 01/13/2018 0551   BILITOT 0.8 01/13/2018 0551   GFRNONAA >60 01/14/2018 0310   GFRAA >60 01/14/2018 0310    GFR Estimated Creatinine Clearance: 104.5 mL/min (by C-G formula based on SCr of 0.61 mg/dL). No results for input(s): LIPASE, AMYLASE in the last 72 hours. No results for input(s): CKTOTAL, CKMB, CKMBINDEX, TROPONINI in the last 72 hours. Invalid input(s): POCBNP No results for input(s): DDIMER in the last 72 hours. No results for input(s): HGBA1C in the last 72 hours. No results for input(s): CHOL, HDL, LDLCALC, TRIG, CHOLHDL, LDLDIRECT in the last 72 hours. No results for input(s): TSH, T4TOTAL, T3FREE, THYROIDAB in the last 72 hours.  Invalid input(s): FREET3 No results for input(s): VITAMINB12, FOLATE, FERRITIN, TIBC, IRON, RETICCTPCT in the last 72 hours. Coags: No results for input(s): INR in the last 72 hours.  Invalid input(s): PT Microbiology: Recent Results (from the past 240 hour(s))  Culture, blood (routine x 2) Call MD if unable to obtain prior to antibiotics being given     Status: None   Collection Time: 01/10/18  5:45 AM  Result Value Ref Range Status   Specimen Description BLOOD RIGHT ANTECUBITAL  Final   Special Requests   Final    BOTTLES DRAWN AEROBIC AND ANAEROBIC Blood  Culture adequate volume   Culture   Final    NO GROWTH 5 DAYS Performed at [facility], [address], [place], [place] [address]    Report Status 01/15/2018 FINAL  Final  Culture, blood (routine x 2) Call MD if unable to obtain prior to antibiotics being given     Status: None   Collection Time: 01/10/18  5:46 AM  Result Value Ref Range Status   Specimen Description BLOOD BLOOD RIGHT HAND  Final   Special Requests   Final    BOTTLES DRAWN AEROBIC AND ANAEROBIC Blood Culture results may not be optimal due to an inadequate volume of blood received in culture bottles   Culture   Final    NO GROWTH 5 DAYS Performed at [facility], [address] [place], [place] [address]    Report Status 01/15/2018 FINAL  Final  Culture, sputum-assessment     Status: None   Collection Time: 01/10/18 11:35 PM  Result Value Ref Range Status   Specimen Description EXPECTORATED SPUTUM  Final   Special Requests NONE  Final   Sputum  evaluation   Final    THIS SPECIMEN IS ACCEPTABLE FOR SPUTUM CULTURE Performed at [facility], [address] [place], [place] [address]    Report Status 01/11/2018 FINAL  Final  Culture, respiratory     Status: None   Collection Time: 01/10/18 11:35 PM  Result Value Ref Range Status   Specimen Description EXPECTORATED SPUTUM  Final   Special Requests NONE Reflexed from [id number]  Final   Gram Stain   Final    ABUNDANT WBC PRESENT,BOTH PMN AND MONONUCLEAR RARE GRAM POSITIVE COCCI    Culture   Final    RARE Consistent with normal respiratory flora. Performed at [facility], [address], [place], [place] [address]    Report Status 01/13/2018 FINAL  Final  Respiratory Panel by PCR     Status: Abnormal   Collection Time: 01/11/18  2:26 AM  Result Value Ref Range Status   Adenovirus NOT DETECTED NOT DETECTED Final   Coronavirus 229E NOT DETECTED NOT DETECTED Final   Coronavirus HKU1 NOT DETECTED NOT DETECTED Final   Coronavirus NL63 NOT  DETECTED NOT DETECTED Final   Coronavirus OC43 NOT DETECTED NOT DETECTED Final   Metapneumovirus NOT DETECTED NOT DETECTED Final   Rhinovirus / Enterovirus DETECTED (A) NOT DETECTED Final   Influenza A NOT DETECTED NOT DETECTED Final   Influenza B NOT DETECTED NOT DETECTED Final   Parainfluenza Virus 1 DETECTED (A) NOT DETECTED Final   Parainfluenza Virus 2 NOT DETECTED NOT DETECTED Final   Parainfluenza Virus 3 NOT DETECTED NOT DETECTED Final   Parainfluenza Virus 4 NOT DETECTED NOT DETECTED Final   Respiratory Syncytial Virus NOT DETECTED NOT DETECTED Final   Bordetella pertussis NOT DETECTED NOT DETECTED Final   Chlamydophila pneumoniae NOT DETECTED NOT DETECTED Final   Mycoplasma pneumoniae NOT DETECTED NOT DETECTED Final    Comment: Performed at [facility], [address], [place], [place] [address]    FURTHER DISCHARGE INSTRUCTIONS:  Get Medicines reviewed and adjusted: Please take all your medications with you for your next visit with your Primary MD  Laboratory/radiological data: Please request your Primary MD to go over all hospital tests and procedure/radiological results at the follow up, please ask your Primary MD to get all Hospital records sent to his/her office.  In some cases, they will be blood work, cultures and biopsy results pending at the time of your discharge. Please request that your primary care M.D. goes through all the records of your hospital data and follows up on these results.  Also Note the following: If you experience worsening of your admission symptoms, develop shortness of breath, life threatening emergency, suicidal or homicidal thoughts you must seek medical attention immediately by calling 911 or calling your MD immediately  if symptoms less severe.  You must read complete instructions/literature along with all the possible adverse reactions/side effects for all the Medicines you take and that have been prescribed to you. Take any new  Medicines after you have completely understood and accpet all the possible adverse reactions/side effects.   Do not drive when taking Pain medications or sleeping medications (Benzodaizepines)  Do not take more than prescribed Pain, Sleep and Anxiety Medications. It is not advisable to combine anxiety,sleep and pain medications without talking with your primary care practitioner  Special Instructions: If you have smoked or chewed Tobacco  in the last 2 yrs please stop smoking, stop any regular Alcohol  and or any Recreational drug use.  Wear  Seat belts while driving.  Please note: You were cared for by a hospitalist during your hospital stay. Once you are discharged, your primary care physician will handle any further medical issues. Please note that NO REFILLS for any discharge medications will be authorized once you are discharged, as it is imperative that you return to your primary care physician (or establish a relationship with a primary care physician if you do not have one) for your post hospital discharge needs so that they can reassess your need for medications and monitor your lab values.  Total Time spent coordinating discharge including counseling, education and face to face time equals 35 minutes.  SignedJeoffrey Massed 01/15/2018 8:39 AM

## 2018-01-15 NOTE — Progress Notes (Signed)
Maria Owens to be D/C'd Home per MD order.  Discussed with the patient and all questions fully answered.  VSS, Skin clean, dry and intact without evidence of skin break down, no evidence of skin tears noted. IV catheter discontinued intact. Site without signs and symptoms of complications. Dressing and pressure applied.  An After Visit Summary was printed and given to the patient. Patient received prescription.  D/c education completed with patient/family including follow up instructions, medication list, d/c activities limitations if indicated, with other d/c instructions as indicated by MD - patient able to verbalize understanding, all questions fully answered.   Patient instructed to return to ED, call 911, or call MD for any changes in condition.   Patient escorted via WC, and D/C home via private auto.  [name] 01/15/2018 10:38 AM

## 2019-10-27 IMAGING — DX DG CHEST 1V PORT
1 series · 1 of 1 positions shown · non-contrast
Comparison: 01/09/2018, CT chest 01/10/2018

CLINICAL DATA: Shortness of breath

EXAM:
PORTABLE CHEST 1 VIEW

[chest]
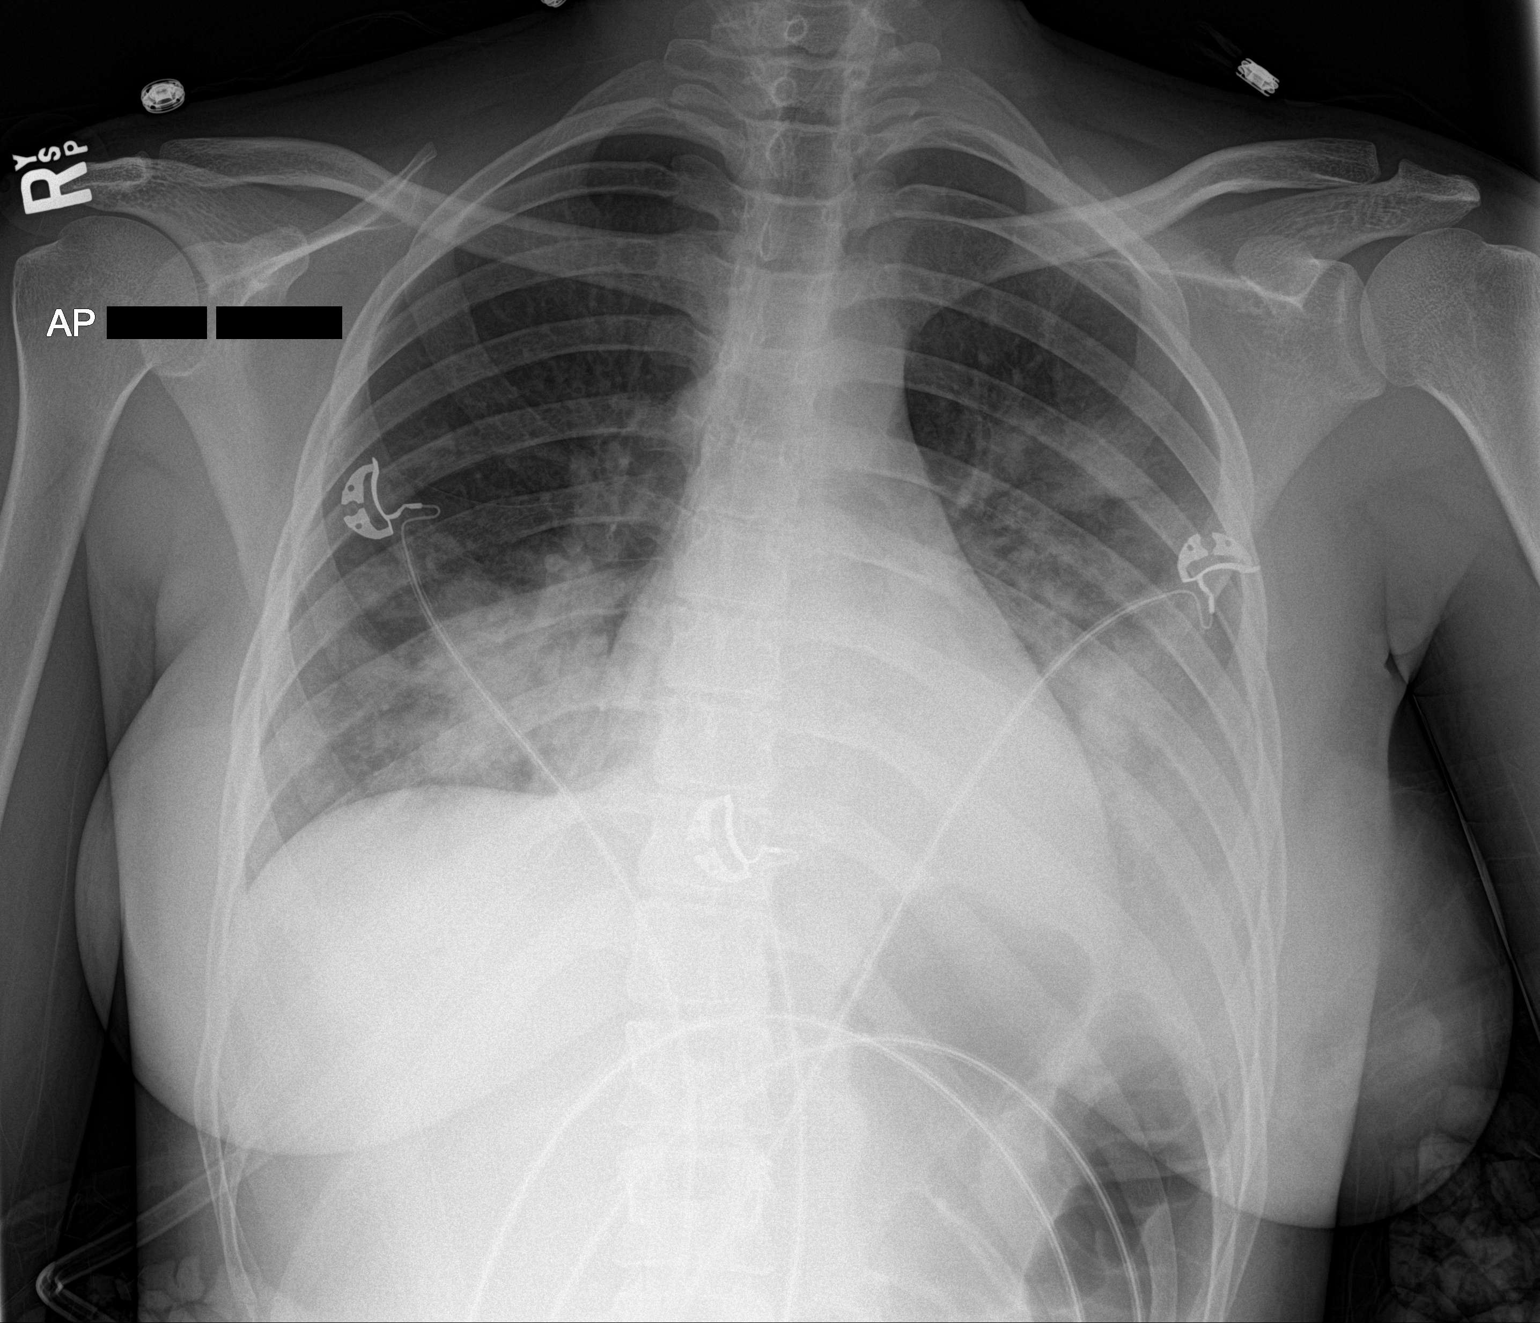

[1 of 1 positions shown; findings below may reference images not displayed]

FINDINGS: Heart size within normal limits. Interval worsening of multifocal
consolidations and ground-glass opacity with dense consolidation at
the left base. No pneumothorax.
IMPRESSION: Significant interval radiographic worsening of multifocal bilateral
left greater than right consolidations and ground-glass densities,
differential of which includes multifocal pneumonia, pulmonary
hemorrhage, and inhalation injury.
